# Patient Record
Sex: Female | Born: 1950 | Race: White | Hispanic: No | Marital: Single | Smoking: Never smoker
Health system: Southern US, Community
[De-identification: ages and names within clinical notes are randomized; demographics above are authoritative.]

## PROBLEM LIST (undated history)

## (undated) DIAGNOSIS — S0300XA Dislocation of jaw, unspecified side, initial encounter: Secondary | ICD-10-CM

## (undated) DIAGNOSIS — K219 Gastro-esophageal reflux disease without esophagitis: Secondary | ICD-10-CM

## (undated) DIAGNOSIS — T753XXA Motion sickness, initial encounter: Secondary | ICD-10-CM

## (undated) DIAGNOSIS — Z8489 Family history of other specified conditions: Secondary | ICD-10-CM

## (undated) DIAGNOSIS — K922 Gastrointestinal hemorrhage, unspecified: Secondary | ICD-10-CM

## (undated) HISTORY — PX: GANGLION CYST EXCISION: SHX1691

---

## 1997-12-29 ENCOUNTER — Other Ambulatory Visit: Admission: RE | Admit: 1997-12-29 | Discharge: 1997-12-29 | Payer: Self-pay | Admitting: Gynecology

## 1999-01-14 ENCOUNTER — Other Ambulatory Visit: Admission: RE | Admit: 1999-01-14 | Discharge: 1999-01-14 | Payer: Self-pay | Admitting: Gynecology

## 2000-01-17 ENCOUNTER — Other Ambulatory Visit: Admission: RE | Admit: 2000-01-17 | Discharge: 2000-01-17 | Payer: Self-pay | Admitting: Gynecology

## 2001-01-28 ENCOUNTER — Other Ambulatory Visit: Admission: RE | Admit: 2001-01-28 | Discharge: 2001-01-28 | Payer: Self-pay | Admitting: Gynecology

## 2002-01-29 ENCOUNTER — Other Ambulatory Visit: Admission: RE | Admit: 2002-01-29 | Discharge: 2002-01-29 | Payer: Self-pay | Admitting: Gynecology

## 2003-03-02 ENCOUNTER — Other Ambulatory Visit: Admission: RE | Admit: 2003-03-02 | Discharge: 2003-03-02 | Payer: Self-pay | Admitting: Gynecology

## 2003-07-25 HISTORY — PX: NASOLACRIMAL DUCT PROBING W/ INSERTION OF STENT: SHX2074

## 2004-03-02 ENCOUNTER — Other Ambulatory Visit: Admission: RE | Admit: 2004-03-02 | Discharge: 2004-03-02 | Payer: Self-pay | Admitting: Gynecology

## 2005-04-05 ENCOUNTER — Other Ambulatory Visit: Admission: RE | Admit: 2005-04-05 | Discharge: 2005-04-05 | Payer: Self-pay | Admitting: Gynecology

## 2005-09-29 DIAGNOSIS — N951 Menopausal and female climacteric states: Secondary | ICD-10-CM | POA: Insufficient documentation

## 2006-04-09 ENCOUNTER — Other Ambulatory Visit: Admission: RE | Admit: 2006-04-09 | Discharge: 2006-04-09 | Payer: Self-pay | Admitting: Gynecology

## 2006-12-13 ENCOUNTER — Ambulatory Visit: Payer: Self-pay

## 2008-04-09 ENCOUNTER — Other Ambulatory Visit: Admission: RE | Admit: 2008-04-09 | Discharge: 2008-04-09 | Payer: Self-pay | Admitting: Gynecology

## 2009-09-14 ENCOUNTER — Ambulatory Visit: Payer: Self-pay | Admitting: Family Medicine

## 2009-12-27 DIAGNOSIS — E559 Vitamin D deficiency, unspecified: Secondary | ICD-10-CM | POA: Insufficient documentation

## 2010-11-07 ENCOUNTER — Ambulatory Visit: Payer: Self-pay | Admitting: Family Medicine

## 2010-12-15 ENCOUNTER — Ambulatory Visit: Payer: Self-pay | Admitting: Family Medicine

## 2011-10-31 LAB — HM DEXA SCAN

## 2011-11-30 ENCOUNTER — Ambulatory Visit: Payer: Self-pay | Admitting: Family Medicine

## 2011-11-30 LAB — HM MAMMOGRAPHY

## 2014-09-30 ENCOUNTER — Other Ambulatory Visit: Payer: Self-pay | Admitting: Gynecology

## 2014-09-30 DIAGNOSIS — R928 Other abnormal and inconclusive findings on diagnostic imaging of breast: Secondary | ICD-10-CM

## 2014-10-07 ENCOUNTER — Ambulatory Visit
Admission: RE | Admit: 2014-10-07 | Discharge: 2014-10-07 | Disposition: A | Discharge status: Home / Self Care | Payer: 59 | Source: Ambulatory Visit | Attending: Gynecology | Admitting: Gynecology

## 2014-10-07 DIAGNOSIS — R928 Other abnormal and inconclusive findings on diagnostic imaging of breast: Secondary | ICD-10-CM

## 2015-03-10 ENCOUNTER — Other Ambulatory Visit: Payer: Self-pay

## 2015-03-10 DIAGNOSIS — K30 Functional dyspepsia: Secondary | ICD-10-CM | POA: Insufficient documentation

## 2015-03-10 DIAGNOSIS — R638 Other symptoms and signs concerning food and fluid intake: Secondary | ICD-10-CM | POA: Insufficient documentation

## 2015-03-22 ENCOUNTER — Encounter: Payer: Self-pay | Admitting: Family Medicine

## 2015-04-12 ENCOUNTER — Other Ambulatory Visit: Payer: Self-pay | Admitting: Family Medicine

## 2015-04-12 ENCOUNTER — Ambulatory Visit (INDEPENDENT_AMBULATORY_CARE_PROVIDER_SITE_OTHER): Payer: 59 | Admitting: Family Medicine

## 2015-04-12 ENCOUNTER — Encounter: Payer: Self-pay | Admitting: Family Medicine

## 2015-04-12 VITALS — BP 160/84 | HR 92 | Temp 98.3°F | Resp 18 | Ht 67.75 in | Wt 141.8 lb

## 2015-04-12 DIAGNOSIS — M419 Scoliosis, unspecified: Secondary | ICD-10-CM

## 2015-04-12 DIAGNOSIS — M4124 Other idiopathic scoliosis, thoracic region: Secondary | ICD-10-CM | POA: Diagnosis not present

## 2015-04-12 DIAGNOSIS — R3915 Urgency of urination: Secondary | ICD-10-CM | POA: Diagnosis not present

## 2015-04-12 DIAGNOSIS — Z0000 Encounter for general adult medical examination without abnormal findings: Secondary | ICD-10-CM | POA: Diagnosis not present

## 2015-04-12 LAB — POCT URINALYSIS DIPSTICK
Bilirubin, UA: NEGATIVE
Blood, UA: NEGATIVE
Glucose, UA: NEGATIVE
Ketones, UA: NEGATIVE
Nitrite, UA: NEGATIVE
Protein, UA: NEGATIVE
Spec Grav, UA: 1.01
Urobilinogen, UA: 0.2
pH, UA: 7.5

## 2015-04-12 NOTE — Progress Notes (Signed)
Patient ID: Robin Warren, female   DOB: [birth year], 64 y.o.   MRN: [id number]       Patient: Robin Warren, Female    DOB: 1950/11/22, 64 y.o.   MRN: [id number] Visit Date: 04/12/2015  Today's Provider: Vernie Murders, PA   Chief Complaint  Patient presents with  . Annual Exam   Subjective:    Annual physical exam Robin Warren is a 64 y.o. female who presents today for health maintenance and complete physical. She feels well. She reports exercising twice a week. She reports she is sleeping fairly well (6-7 hours a night).  -----------------------------------------------------------------   Review of Systems  Constitutional: Negative.   HENT: Negative.   Eyes: Negative.   Respiratory: Negative.   Cardiovascular: Negative.   Gastrointestinal: Negative.   Endocrine: Negative.   Genitourinary: Negative.   Musculoskeletal: Negative.   Skin: Negative.   Allergic/Immunologic: Negative.   Neurological: Negative.   Hematological: Negative.   Psychiatric/Behavioral: Negative.     Social History She  reports that she has never smoked. She has never used smokeless tobacco. She reports that she drinks alcohol. She reports that she does not use illicit drugs. Social History   Social History  . Marital Status: Single    Spouse Name: N/A  . Number of Children: N/A  . Years of Education: N/A   Social History Main Topics  . Smoking status: Never Smoker   . Smokeless tobacco: Never Used  . Alcohol Use: 0.0 oz/week    0 Standard drinks or equivalent per week     Comment: OCCASIONALLY  . Drug Use: No  . Sexual Activity: Not Asked   Other Topics Concern  . None   Social History Narrative    Patient Active Problem List   Diagnosis Date Noted  . Acid indigestion 03/10/2015  . Abnormal craving 03/10/2015  . Avitaminosis D 12/27/2009  . Menopausal symptom 09/29/2005    Past Surgical History  Procedure Laterality Date  . Nasolacrimal duct probing w/ insertion of stent   2005    Family History  Family Status  Relation Status Death Age  . Mother Deceased   . Maternal Grandmother Deceased   . Maternal Grandfather Deceased   . Paternal Grandmother Deceased   . Paternal Grandfather Deceased    Her family history includes Healthy in her sister; Heart attack in her paternal grandfather; Heart failure in her maternal grandfather and paternal grandfather; Parkinson's disease in her paternal grandmother; Stroke in her maternal grandmother and paternal grandfather.    Allergies  Allergen Reactions  . Aspirin     Stomach bleed    Previous Medications   DIPHENHYDRAMINE-ACETAMINOPHEN (TYLENOL PM) 25-500 MG TABS    Take 1 tablet by mouth at bedtime.   MULTIPLE VITAMIN PO    Take 1 tablet by mouth daily.   RANITIDINE (ZANTAC) 75 MG TABLET    Take 1 tablet by mouth as needed.    Patient Care Team: Margo Common, PA as PCP - General (Physician Assistant)     Objective:   Vitals: BP 160/84 mmHg  Pulse 92  Temp(Src) 98.3 F (36.8 C) (Oral)  Resp 18  Ht 5' 7.75" (1.721 m)  Wt 141 lb 12.8 oz (64.32 kg)  BMI 21.72 kg/m2  SpO2 98%   Physical Exam  Constitutional: She is oriented to person, place, and time. She appears well-developed and well-nourished.  HENT:  Head: Normocephalic and atraumatic.  Right Ear: External ear normal.  Left Ear: External ear normal.  Nose: Nose normal.  Mouth/Throat: Oropharynx is clear and moist.  Eyes: Conjunctivae and EOM are normal. Pupils are equal, round, and reactive to light.  Neck: Normal range of motion. Neck supple.  Cardiovascular: Normal rate, regular rhythm, normal heart sounds and intact distal pulses.   Pulmonary/Chest: Effort normal and breath sounds normal.  Abdominal: Soft. Bowel sounds are normal.  Genitourinary:  Breast exam, mammograms and PAP accomplished by Dr. Carren Rang (GYN) March 2016.  Musculoskeletal: Normal range of motion.  Thoracic scoliosis with convexity to the right.  Neurological: She  is alert and oriented to person, place, and time. She has normal reflexes.  Skin: Skin is warm and dry.  Psychiatric: She has a normal mood and affect. Her behavior is normal. Thought content normal.   Depression Screen Sleeping 6-7 hours a night. No spontaneous crying spells, good appetite, very interactive and bright spirits.  Assessment & Plan:     Routine Health Maintenance and Physical Exam  Exercise Activities and Dietary recommendations Continue regular exercise at [employer] twice a week.  Immunization History  Administered Date(s) Administered  . Tdap 10/23/2011  . Zoster 01/31/2013    There are no preventive care reminders to display for this patient.    Discussed health benefits of physical activity, and encouraged her to engage in regular exercise appropriate for her age and condition.    --------------------------------------------------------------------  1. Annual physical exam Good general health. Declines colonoscopy. Last Tdap was 10-23-11 and Zostavax was 01-31-13. Wants to check with insurance about Prevnar coverage. Las BMD was in 2015 by her GYN and reports as being normal. BP elevated upon arrival to the office today (coming down to 146/90 in 10 minutes). Recommend she limit salt intake (admits to free usage) and recheck BP. Will bring lab reports from recent health screening at work to see if additional labs needed. - POCT urinalysis dipstick  2. Thoracic scoliosis Stable. 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 May use Tylenol or Percogesic prn with moist heat applications. May need repeat x-ray evaluation if progressive discomfort.  3. Urgency of urination Recent awareness of urgency to urinate without frequency or dysuria. No hematuria and some WBC's on urinalysis today. Will check urine C&S to rule out infection. Encouraged to increase water intake to flush system. Recheck pending culture report. - Urine culture

## 2015-04-14 LAB — URINE CULTURE

## 2015-04-15 ENCOUNTER — Telehealth: Payer: Self-pay

## 2015-04-15 DIAGNOSIS — R350 Frequency of micturition: Secondary | ICD-10-CM

## 2015-04-15 DIAGNOSIS — R3915 Urgency of urination: Secondary | ICD-10-CM

## 2015-04-15 MED ORDER — NITROFURANTOIN MONOHYD MACRO 100 MG PO CAPS: 100.0000 mg | ORAL_CAPSULE | Freq: Two times a day (BID) | ORAL | Status: DC

## 2015-04-15 NOTE — Telephone Encounter (Signed)
Patient advised as directed below. Patient verbalized understanding. Patient states the frequency and urgency continues. RX sent to pharmacy. Patient scheduled for follow up appointment.

## 2015-04-15 NOTE — Telephone Encounter (Signed)
-----   Message from Margo Common, [place] sent at 04/15/2015  8:38 AM EDT ----- No pathologic growth of bacteria on culture. If frequency and urgency continues, recommend Macrobid 100 mg BID #14 and recheck urine in 10 days. Otherwise, drinking extra fluids should flush this out. May use Cranberry capsules or juice (4-6 ounces a day) to help.

## 2015-04-26 ENCOUNTER — Ambulatory Visit (INDEPENDENT_AMBULATORY_CARE_PROVIDER_SITE_OTHER): Payer: 59 | Admitting: Family Medicine

## 2015-04-26 ENCOUNTER — Encounter: Payer: Self-pay | Admitting: Family Medicine

## 2015-04-26 VITALS — BP 148/80 | HR 106 | Temp 98.4°F | Resp 18 | Wt 140.8 lb

## 2015-04-26 DIAGNOSIS — IMO0001 Reserved for inherently not codable concepts without codable children: Secondary | ICD-10-CM

## 2015-04-26 DIAGNOSIS — R030 Elevated blood-pressure reading, without diagnosis of hypertension: Secondary | ICD-10-CM | POA: Diagnosis not present

## 2015-04-26 DIAGNOSIS — R3915 Urgency of urination: Secondary | ICD-10-CM | POA: Diagnosis not present

## 2015-04-26 LAB — POCT URINALYSIS DIPSTICK
Bilirubin, UA: NEGATIVE
Glucose, UA: NEGATIVE
Ketones, UA: NEGATIVE
Nitrite, UA: NEGATIVE
Protein, UA: NEGATIVE
Spec Grav, UA: 1.02
Urobilinogen, UA: 0.2
pH, UA: 6

## 2015-04-26 NOTE — Progress Notes (Signed)
Patient ID: Robin Warren, female   DOB: 1951/06/16, 64 y.o.   MRN: [id number]   Chief Complaint  Patient presents with  . Follow-up   Subjective:  HPI  This 64 year old female presents for follow up of elevated BP and urinary urgency at CPE on 04-12-15. Has been drinking extra water/fluids. Urine culture was negative for bacterial growth. Has been restricting sodium intake and checking BP at home (ranging from 150's/80's. Denies chest pains, dyspnea, diaphoresis or palpitations. Took the Macrobid until culture report showed no bacteria.   Prior to Admission medications   Medication Sig Start Date End Date Taking? Authorizing Provider  diphenhydramine-acetaminophen (TYLENOL PM) 25-500 MG TABS Take 1 tablet by mouth at bedtime.   Yes Historical Provider, MD  MULTIPLE VITAMIN PO Take 1 tablet by mouth daily.   Yes Historical Provider, MD  nitrofurantoin, macrocrystal-monohydrate, (MACROBID) 100 MG capsule Take 1 capsule (100 mg total) by mouth 2 (two) times daily. 04/15/15  Yes Dennis E Chrismon, PA  ranitidine (ZANTAC) 75 MG tablet Take 1 tablet by mouth as needed.   Yes Historical Provider, MD    Patient Active Problem List   Diagnosis Date Noted  . Thoracic scoliosis 04/12/2015  . Acid indigestion 03/10/2015  . Abnormal craving 03/10/2015  . Avitaminosis D 12/27/2009  . Menopausal symptom 09/29/2005   Social History   Social History  . Marital Status: Single    Spouse Name: N/A  . Number of Children: N/A  . Years of Education: N/A   Occupational History  . Not on file.   Social History Main Topics  . Smoking status: Never Smoker   . Smokeless tobacco: Never Used  . Alcohol Use: 0.0 oz/week    0 Standard drinks or equivalent per week     Comment: OCCASIONALLY  . Drug Use: No  . Sexual Activity: Not on file   Other Topics Concern  . Not on file   Social History Narrative    Allergies  Allergen Reactions  . Aspirin     Stomach bleed    Review of Systems    Constitutional: Negative.   HENT: Negative.   Eyes: Negative.   Respiratory: Negative.   Cardiovascular: Negative.   Gastrointestinal: Negative.   Genitourinary: Positive for urgency.  Musculoskeletal: Negative.   Skin: Negative.   Neurological: Negative.   Endo/Heme/Allergies: Negative.   Psychiatric/Behavioral: Negative.     Immunization History  Administered Date(s) Administered  . Tdap 10/23/2011  . Zoster 01/31/2013   Objective:  BP 148/80 mmHg  Pulse 106  Temp(Src) 98.4 F (36.9 C) (Oral)  Resp 18  Wt 140 lb 12.8 oz (63.866 kg)  SpO2 98%  Physical Exam  Constitutional: She is oriented to person, place, and time and well-developed, well-nourished, and in no distress.  HENT:  Head: Normocephalic.  Eyes: Conjunctivae are normal.  Cardiovascular: Normal rate and regular rhythm.   Pulmonary/Chest: Effort normal and breath sounds normal.  Abdominal: Soft. Bowel sounds are normal.  Musculoskeletal: Normal range of motion.  Neurological: She is alert and oriented to person, place, and time.  Psychiatric: Memory, affect and judgment normal.    Assessment and Plan :  1. Urgency of urination No further urgency symptoms. Urinalysis negative and urine culture report form 04-12-15 showed no bacterial growth. Finished the Macrobid and symptoms promptly went away. Continue to drink plenty of fluids and recheck prn. - POCT urinalysis dipstick  2. Elevated blood pressure BP coming down from the 160/84 at the last OV. Has  been restricting caffeine and sodium intake. Exercising routinely 2-3 times a week at [employer]. Labs at the Health Screening at work showed total cholesterol 206, HDL 86, LDL 97, triglycerides 114, glucose 84, Hgb A1C 5.8% and BMI was 21.4. Recheck BP in 2 months.   [facility] 04/26/2015 3:56 PM

## 2015-04-27 ENCOUNTER — Encounter: Payer: Self-pay | Admitting: Family Medicine

## 2015-05-13 ENCOUNTER — Telehealth: Payer: Self-pay | Admitting: Family Medicine

## 2015-05-13 DIAGNOSIS — J0100 Acute maxillary sinusitis, unspecified: Secondary | ICD-10-CM

## 2015-05-13 MED ORDER — AMOXICILLIN 875 MG PO TABS: 875.0000 mg | ORAL_TABLET | Freq: Two times a day (BID) | ORAL | Status: DC

## 2015-05-13 NOTE — Telephone Encounter (Signed)
Developed sinus pressure headache, nausea, diarrhea, nasal stuffiness and some chills 2 days ago. Tried Allegra-D and Tylenol with Meclizine. Diarrhea and nausea has abated but still having headache and now some cough. Started some leftover Amoxicillin and feeling better today. Will refill Amoxicillin to complete a 10 day regime. May add Delsym or Mucinex-DM for cough. Increase fluid intake and rest at home today. If no better in 3 days, should schedule appointment in the office. Patient agrees with plan.

## 2015-07-06 ENCOUNTER — Ambulatory Visit: Payer: 59 | Admitting: Family Medicine

## 2015-07-09 ENCOUNTER — Ambulatory Visit (INDEPENDENT_AMBULATORY_CARE_PROVIDER_SITE_OTHER): Payer: 59 | Admitting: Family Medicine

## 2015-07-09 ENCOUNTER — Encounter: Payer: Self-pay | Admitting: Family Medicine

## 2015-07-09 VITALS — BP 162/94 | HR 80 | Temp 98.2°F | Resp 16 | Wt 145.8 lb

## 2015-07-09 DIAGNOSIS — I10 Essential (primary) hypertension: Secondary | ICD-10-CM

## 2015-07-09 MED ORDER — METOPROLOL TARTRATE 25 MG PO TABS: ORAL_TABLET | ORAL | Status: DC

## 2015-07-09 NOTE — Patient Instructions (Signed)
Hypertension Hypertension, commonly called high blood pressure, is when the force of blood pumping through your arteries is too strong. Your arteries are the blood vessels that carry blood from your heart throughout your body. A blood pressure reading consists of a higher number over a lower number, such as 110/72. The higher number (systolic) is the pressure inside your arteries when your heart pumps. The lower number (diastolic) is the pressure inside your arteries when your heart relaxes. Ideally you want your blood pressure below 120/80. Hypertension forces your heart to work harder to pump blood. Your arteries may become narrow or stiff. Having untreated or uncontrolled hypertension can cause heart attack, stroke, kidney disease, and other problems. RISK FACTORS Some risk factors for high blood pressure are controllable. Others are not.  Risk factors you cannot control include:   Race. You may be at higher risk if you are African American.  Age. Risk increases with age.  Gender. Men are at higher risk than women before age 45 years. After age 65, women are at higher risk than men. Risk factors you can control include:  Not getting enough exercise or physical activity.  Being overweight.  Getting too much fat, sugar, calories, or salt in your diet.  Drinking too much alcohol. SIGNS AND SYMPTOMS Hypertension does not usually cause signs or symptoms. Extremely high blood pressure (hypertensive crisis) may cause headache, anxiety, shortness of breath, and nosebleed. DIAGNOSIS To check if you have hypertension, your health care provider will measure your blood pressure while you are seated, with your arm held at the level of your heart. It should be measured at least twice using the same arm. Certain conditions can cause a difference in blood pressure between your right and left arms. A blood pressure reading that is higher than normal on one occasion does not mean that you need treatment. If  it is not clear whether you have high blood pressure, you may be asked to return on a different day to have your blood pressure checked again. Or, you may be asked to monitor your blood pressure at home for 1 or more weeks. TREATMENT Treating high blood pressure includes making lifestyle changes and possibly taking medicine. Living a healthy lifestyle can help lower high blood pressure. You may need to change some of your habits. Lifestyle changes may include:  Following the DASH diet. This diet is high in fruits, vegetables, and whole grains. It is low in salt, red meat, and added sugars.  Keep your sodium intake below 2,300 mg per day.  Getting at least 30-45 minutes of aerobic exercise at least 4 times per week.  Losing weight if necessary.  Not smoking.  Limiting alcoholic beverages.  Learning ways to reduce stress. Your health care provider may prescribe medicine if lifestyle changes are not enough to get your blood pressure under control, and if one of the following is true:  You are 18-59 years of age and your systolic blood pressure is above 140.  You are 60 years of age or older, and your systolic blood pressure is above 150.  Your diastolic blood pressure is above 90.  You have diabetes, and your systolic blood pressure is over 140 or your diastolic blood pressure is over 90.  You have kidney disease and your blood pressure is above 140/90.  You have heart disease and your blood pressure is above 140/90. Your personal target blood pressure may vary depending on your medical conditions, your age, and other factors. HOME CARE INSTRUCTIONS    Have your blood pressure rechecked as directed by your health care provider.   Take medicines only as directed by your health care provider. Follow the directions carefully. Blood pressure medicines must be taken as prescribed. The medicine does not work as well when you skip doses. Skipping doses also puts you at risk for  problems.  Do not smoke.   Monitor your blood pressure at home as directed by your health care provider. SEEK MEDICAL CARE IF:   You think you are having a reaction to medicines taken.  You have recurrent headaches or feel dizzy.  You have swelling in your ankles.  You have trouble with your vision. SEEK IMMEDIATE MEDICAL CARE IF:  You develop a severe headache or confusion.  You have unusual weakness, numbness, or feel faint.  You have severe chest or abdominal pain.  You vomit repeatedly.  You have trouble breathing. MAKE SURE YOU:   Understand these instructions.  Will watch your condition.  Will get help right away if you are not doing well or get worse.   This information is not intended to replace advice given to you by your health care provider. Make sure you discuss any questions you have with your health care provider.   Document Released: 07/10/2005 Document Revised: 11/24/2014 Document Reviewed: 05/02/2013 Elsevier Interactive Patient Education 2016 [employer] Inc[employer]  

## 2015-07-09 NOTE — Progress Notes (Signed)
Patient ID: Robin Warren, female   DOB: 17-Mar-1951, 64 y.o.   MRN: [id number]   Patient: Robin Warren Female    DOB: 06-23-1951   64 y.o.   MRN: [id number] Visit Date: 07/09/2015  Today's Provider: Vernie Murders, PA   Chief Complaint  Patient presents with  . Hypertension   Subjective:    Hypertension This is a new problem. The problem is uncontrolled. Past treatments include nothing.      Previous Medications   AMOXICILLIN (AMOXIL) 875 MG TABLET    Take 1 tablet (875 mg total) by mouth 2 (two) times daily.   DIPHENHYDRAMINE-ACETAMINOPHEN (TYLENOL PM) 25-500 MG TABS    Take 1 tablet by mouth at bedtime.   MULTIPLE VITAMIN PO    Take 1 tablet by mouth daily.   RANITIDINE (ZANTAC) 75 MG TABLET    Take 1 tablet by mouth as needed.    Review of Systems  Constitutional: Negative.   HENT: Negative.   Eyes: Negative.   Respiratory: Negative.   Cardiovascular: Negative.   Gastrointestinal: Negative.   Endocrine: Negative.   Genitourinary: Negative.   Musculoskeletal: Negative.   Skin: Negative.   Allergic/Immunologic: Negative.   Neurological: Negative.   Psychiatric/Behavioral: Negative.     Social History  Substance Use Topics  . Smoking status: Never Smoker   . Smokeless tobacco: Never Used  . Alcohol Use: 0.0 oz/week    0 Standard drinks or equivalent per week     Comment: OCCASIONALLY   Objective:   BP 162/94 mmHg  Pulse 80  Temp(Src) 98.2 F (36.8 C) (Oral)  Resp 16  Wt 145 lb 12.8 oz (66.134 kg) BP Readings from Last 3 Encounters:  07/09/15 162/94  04/26/15 148/80  04/12/15 160/84    Physical Exam  Constitutional: She is oriented to person, place, and time. She appears well-developed and well-nourished.  HENT:  Head: Normocephalic.  Right Ear: External ear normal.  Left Ear: External ear normal.  Nose: Nose normal.  Mouth/Throat: Oropharynx is clear and moist.  Eyes: Conjunctivae and EOM are normal. Pupils are equal, round, and reactive to light.    Neck: Normal range of motion. Neck supple.  Cardiovascular: Normal rate, regular rhythm, normal heart sounds and intact distal pulses.   No carotid bruit.  Pulmonary/Chest: Effort normal and breath sounds normal.  Abdominal: Soft. Bowel sounds are normal.  Neurological: She is alert and oriented to person, place, and time.  Psychiatric: She has a normal mood and affect. Her behavior is normal.      Assessment & Plan:     1. Essential hypertension Despite diet, exercise and life style changes, continues to have elevation of BP. Acknowledges some anxiety at times. Will treat with low dose beta blocker and recheck in 1 month. Also, will check BP at home regularly. - metoprolol tartrate (LOPRESSOR) 25 MG tablet; One tablet daily by mouth  Dispense: 30 tablet; Refill: 3

## 2015-08-16 ENCOUNTER — Encounter: Payer: Self-pay | Admitting: Family Medicine

## 2015-08-16 ENCOUNTER — Ambulatory Visit (INDEPENDENT_AMBULATORY_CARE_PROVIDER_SITE_OTHER): Payer: 59 | Admitting: Family Medicine

## 2015-08-16 VITALS — BP 150/78 | HR 84 | Temp 98.6°F | Resp 16 | Wt 142.2 lb

## 2015-08-16 DIAGNOSIS — I10 Essential (primary) hypertension: Secondary | ICD-10-CM | POA: Diagnosis not present

## 2015-08-16 NOTE — Progress Notes (Signed)
Patient ID: Robin Warren, female   DOB: [birth year], 65 y.o.   MRN: [id number]   Patient: Robin Warren Female    DOB: Dec 03, 1950   65 y.o.   MRN: [id number] Visit Date: 08/16/2015  Today's Provider: Vernie Murders, PA   Chief Complaint  Patient presents with  . Hypertension  . Follow-up   Subjective:    HPI  Hypertension, follow-up:  BP Readings from Last 3 Encounters:  08/16/15 150/78  07/09/15 162/94  04/26/15 148/80    She was last seen for hypertension 1 months ago.  BP at that visit was 162/94. Management changes since that visit include none. She reports excellent compliance with treatment. She is not having side effects.  She is exercising. She is adherent to low salt diet.   She is experiencing none.  Patient denies none.   Cardiovascular risk factors include none.  Use of agents associated with hypertension: none and Metoprolol 25 mg.     Weight trend: stable Wt Readings from Last 3 Encounters:  08/16/15 142 lb 3.2 oz (64.501 kg)  07/09/15 145 lb 12.8 oz (66.134 kg)  04/26/15 140 lb 12.8 oz (63.866 kg)    Current diet: in general, a "healthy" diet    ------------------------------------------------------------------------ Patient Active Problem List   Diagnosis Date Noted  . Thoracic scoliosis 04/12/2015  . Acid indigestion 03/10/2015  . Abnormal craving 03/10/2015  . Avitaminosis D 12/27/2009  . Menopausal symptom 09/29/2005   Past Surgical History  Procedure Laterality Date  . Nasolacrimal duct probing w/ insertion of stent  2005   Family History  Problem Relation Age of Onset  . Healthy Sister   . Stroke Maternal Grandmother   . Heart failure Maternal Grandfather   . Parkinson's disease Paternal Grandmother   . Heart attack Paternal Grandfather   . Heart failure Paternal Grandfather   . Stroke Paternal Grandfather    Allergies  Allergen Reactions  . Aspirin     Stomach bleed     Previous Medications   DIPHENHYDRAMINE-ACETAMINOPHEN  (TYLENOL PM) 25-500 MG TABS    Take 1 tablet by mouth at bedtime.   METOPROLOL TARTRATE (LOPRESSOR) 25 MG TABLET    One tablet daily by mouth   MULTIPLE VITAMIN PO    Take 1 tablet by mouth daily.   RANITIDINE (ZANTAC) 75 MG TABLET    Take 1 tablet by mouth as needed.    Review of Systems  Constitutional: Negative.   HENT: Negative.   Eyes: Negative.   Respiratory: Negative.   Cardiovascular: Negative.   Gastrointestinal: Negative.   Endocrine: Negative.   Genitourinary: Negative.   Musculoskeletal: Negative.   Skin: Negative.   Allergic/Immunologic: Negative.   Neurological: Negative.   Hematological: Negative.   Psychiatric/Behavioral: Negative.     Social History  Substance Use Topics  . Smoking status: Never Smoker   . Smokeless tobacco: Never Used  . Alcohol Use: 0.0 oz/week    0 Standard drinks or equivalent per week     Comment: OCCASIONALLY   Objective:   BP 150/78 mmHg  Pulse 84  Temp(Src) 98.6 F (37 C) (Oral)  Resp 16  Wt 142 lb 3.2 oz (64.501 kg)  Physical Exam  Constitutional: She is oriented to person, place, and time. She appears well-developed and well-nourished.  HENT:  Head: Normocephalic.  Nose: Nose normal.  Eyes: Conjunctivae and EOM are normal.  Neck: Neck supple.  Cardiovascular: Normal rate and regular rhythm.   Pulmonary/Chest: Effort normal and breath sounds normal.  Musculoskeletal: Normal range of motion.  Neurological: She is alert and oriented to person, place, and time.  Psychiatric: She has a normal mood and affect. Her behavior is normal. Thought content normal.      Assessment & Plan:     1. Essential hypertension Tolerating Metoprolol without side effects. Feeling well and better BP control. Be sure to follow sodium restricted diet and continue exercise program. Recheck in 3 months.

## 2015-11-15 ENCOUNTER — Ambulatory Visit (INDEPENDENT_AMBULATORY_CARE_PROVIDER_SITE_OTHER): Payer: 59 | Admitting: Family Medicine

## 2015-11-15 ENCOUNTER — Encounter: Payer: Self-pay | Admitting: Family Medicine

## 2015-11-15 VITALS — BP 128/90 | HR 75 | Temp 98.3°F | Resp 14 | Wt 146.0 lb

## 2015-11-15 DIAGNOSIS — E559 Vitamin D deficiency, unspecified: Secondary | ICD-10-CM | POA: Diagnosis not present

## 2015-11-15 DIAGNOSIS — I10 Essential (primary) hypertension: Secondary | ICD-10-CM

## 2015-11-15 NOTE — Progress Notes (Signed)
Patient ID: Robin Warren, female   DOB: July 29, 1950, 65 y.o.   MRN: [id number]   Patient: Robin Warren Female    DOB: 02/10/1951   65 y.o.   MRN: [id number] Visit Date: 11/15/2015  Today's Provider: Vernie Murders, PA   Chief Complaint  Patient presents with  . Hypertension  . Follow-up   Subjective:    HPI  Hypertension, follow-up:  BP Readings from Last 3 Encounters:  11/15/15 128/90  08/16/15 150/78  07/09/15 162/94    She was last seen for hypertension 3 months ago.  BP at that visit was 150/78. Management changes since that visit include none. She reports good compliance with treatment. She is not having side effects.  She is exercising. She is adherent to low salt diet.   Outside blood pressures are being checked. She is experiencing none.  Patient denies none.   Cardiovascular risk factors include none.  Use of agents associated with hypertension: none.     Weight trend: stable Wt Readings from Last 3 Encounters:  11/15/15 146 lb (66.225 kg)  08/16/15 142 lb 3.2 oz (64.501 kg)  07/09/15 145 lb 12.8 oz (66.134 kg)    ------------------------------------------------------------------------   Patient Active Problem List   Diagnosis Date Noted  . Thoracic scoliosis 04/12/2015  . Acid indigestion 03/10/2015  . Abnormal craving 03/10/2015  . Avitaminosis D 12/27/2009  . Menopausal symptom 09/29/2005   Past Surgical History  Procedure Laterality Date  . Nasolacrimal duct probing w/ insertion of stent  2005   Family History  Problem Relation Age of Onset  . Healthy Sister   . Stroke Maternal Grandmother   . Heart failure Maternal Grandfather   . Parkinson's disease Paternal Grandmother   . Heart attack Paternal Grandfather   . Heart failure Paternal Grandfather   . Stroke Paternal Grandfather    Previous Medications   DIPHENHYDRAMINE-ACETAMINOPHEN (TYLENOL PM) 25-500 MG TABS    Take 1 tablet by mouth at bedtime.   METOPROLOL TARTRATE (LOPRESSOR) 25  MG TABLET    One tablet daily by mouth   MULTIPLE VITAMIN PO    Take 1 tablet by mouth daily.   RANITIDINE (ZANTAC) 75 MG TABLET    Take 1 tablet by mouth as needed.   Allergies  Allergen Reactions  . Aspirin     Stomach bleed    Review of Systems  Constitutional: Negative.   HENT: Negative.   Eyes: Negative.   Respiratory: Negative.   Cardiovascular: Negative.   Gastrointestinal: Negative.   Endocrine: Negative.   Genitourinary: Negative.   Musculoskeletal: Negative.   Skin: Negative.   Allergic/Immunologic: Negative.   Neurological: Negative.   Hematological: Negative.   Psychiatric/Behavioral: Negative.     Social History  Substance Use Topics  . Smoking status: Never Smoker   . Smokeless tobacco: Never Used  . Alcohol Use: 0.0 oz/week    0 Standard drinks or equivalent per week     Comment: OCCASIONALLY   Objective:   BP 128/90 mmHg  Pulse 75  Temp(Src) 98.3 F (36.8 C) (Oral)  Resp 14  Wt 146 lb (66.225 kg) Body mass index is 22.36 kg/(m^2).  Wt Readings from Last 3 Encounters:  11/15/15 146 lb (66.225 kg)  08/16/15 142 lb 3.2 oz (64.501 kg)  07/09/15 145 lb 12.8 oz (66.134 kg)    Physical Exam  Constitutional: She is oriented to person, place, and time. She appears well-developed and well-nourished. No distress.  HENT:  Head: Normocephalic and atraumatic.  Right Ear:  Hearing normal.  Left Ear: Hearing normal.  Nose: Nose normal.  Eyes: Conjunctivae and lids are normal. Right eye exhibits no discharge. Left eye exhibits no discharge. No scleral icterus.  Cardiovascular: Normal rate and regular rhythm.   Pulmonary/Chest: Effort normal and breath sounds normal. No respiratory distress.  Abdominal: Soft. Bowel sounds are normal.  Musculoskeletal: Normal range of motion.  Neurological: She is alert and oriented to person, place, and time.  Skin: Skin is intact. No lesion and no rash noted.  Psychiatric: She has a normal mood and affect. Her speech is  normal and behavior is normal. Thought content normal.      Assessment & Plan:     1. Essential hypertension Feeling well and tolerating Metoprolol 25 mg qd without fatigue, palpitations or dyspnea. Weight is stable and good BP control. Continue present dosage and check routine labs. Follow up pending reports. - CBC with Differential/Platelet - Comprehensive metabolic panel  2. Avitaminosis D Better compliance with taking Vitamin-D 1000 IU qd since starting BP medications. Recheck blood levels and recheck pending report. - CBC with Differential/Platelet - VITAMIN D 25 Hydroxy (Vit-D Deficiency, Fractures)

## 2015-11-16 ENCOUNTER — Other Ambulatory Visit: Payer: Self-pay

## 2015-11-16 DIAGNOSIS — I10 Essential (primary) hypertension: Secondary | ICD-10-CM

## 2015-11-16 MED ORDER — METOPROLOL TARTRATE 25 MG PO TABS: ORAL_TABLET | ORAL | Status: DC

## 2015-11-16 NOTE — Telephone Encounter (Signed)
Pharmacy requesting refill. Patient uses [facility].

## 2015-11-20 LAB — CBC WITH DIFFERENTIAL/PLATELET
Basophils Absolute: 0.1 10*3/uL (ref 0.0–0.2)
Basos: 1 %
EOS (ABSOLUTE): 0.2 10*3/uL (ref 0.0–0.4)
Eos: 2 %
Hematocrit: 38.3 % (ref 34.0–46.6)
Hemoglobin: 12.7 g/dL (ref 11.1–15.9)
Immature Grans (Abs): 0 10*3/uL (ref 0.0–0.1)
Immature Granulocytes: 0 %
Lymphocytes Absolute: 2.2 10*3/uL (ref 0.7–3.1)
Lymphs: 31 %
MCH: 30.2 pg (ref 26.6–33.0)
MCHC: 33.2 g/dL (ref 31.5–35.7)
MCV: 91 fL (ref 79–97)
Monocytes Absolute: 0.8 10*3/uL (ref 0.1–0.9)
Monocytes: 11 %
Neutrophils Absolute: 4 10*3/uL (ref 1.4–7.0)
Neutrophils: 55 %
Platelets: 276 10*3/uL (ref 150–379)
RBC: 4.21 x10E6/uL (ref 3.77–5.28)
RDW: 13.2 % (ref 12.3–15.4)
WBC: 7.2 10*3/uL (ref 3.4–10.8)

## 2015-11-20 LAB — COMPREHENSIVE METABOLIC PANEL
ALT: 16 IU/L (ref 0–32)
AST: 20 IU/L (ref 0–40)
Albumin/Globulin Ratio: 2 (ref 1.2–2.2)
Albumin: 4.4 g/dL (ref 3.6–4.8)
Alkaline Phosphatase: 70 IU/L (ref 39–117)
BUN/Creatinine Ratio: 18 (ref 12–28)
BUN: 12 mg/dL (ref 8–27)
Bilirubin Total: 0.3 mg/dL (ref 0.0–1.2)
CO2: 26 mmol/L (ref 18–29)
Calcium: 9.2 mg/dL (ref 8.7–10.3)
Chloride: 102 mmol/L (ref 96–106)
Creatinine, Ser: 0.66 mg/dL (ref 0.57–1.00)
GFR calc Af Amer: 109 mL/min/{1.73_m2} (ref >59)
GFR calc non Af Amer: 95 mL/min/{1.73_m2} (ref >59)
Globulin, Total: 2.2 g/dL (ref 1.5–4.5)
Glucose: 85 mg/dL (ref 65–99)
Potassium: 4.1 mmol/L (ref 3.5–5.2)
Sodium: 143 mmol/L (ref 134–144)
Total Protein: 6.6 g/dL (ref 6.0–8.5)

## 2015-11-20 LAB — VITAMIN D 25 HYDROXY (VIT D DEFICIENCY, FRACTURES): Vit D, 25-Hydroxy: 39.1 ng/mL (ref 30.0–100.0)

## 2015-11-22 ENCOUNTER — Telehealth: Payer: Self-pay

## 2015-11-22 NOTE — Telephone Encounter (Signed)
-----   Message from Margo Common, [place] sent at 11/22/2015  8:36 AM EDT ----- All blood tests normal. Vitamin-D level back to normal, also. Recommend taking 1000 IU Vitamin-D daily to maintain normal levels. Recheck annually.

## 2015-11-22 NOTE — Telephone Encounter (Signed)
LMTCB

## 2015-11-22 NOTE — Telephone Encounter (Signed)
Patient advised as directed below. Patient verbalized understanding.  

## 2016-01-10 ENCOUNTER — Encounter: Payer: Self-pay | Admitting: Family Medicine

## 2016-01-10 ENCOUNTER — Ambulatory Visit (INDEPENDENT_AMBULATORY_CARE_PROVIDER_SITE_OTHER): Payer: 59 | Admitting: Family Medicine

## 2016-01-10 VITALS — BP 124/80 | HR 68 | Temp 97.8°F | Resp 16 | Wt 148.0 lb

## 2016-01-10 DIAGNOSIS — M26629 Arthralgia of temporomandibular joint, unspecified side: Secondary | ICD-10-CM

## 2016-01-10 NOTE — Progress Notes (Signed)
Patient ID: Robin Warren, female   DOB: 10-Mar-1951, 65 y.o.   MRN: [id number]       Patient: Robin Warren Female    DOB: 07/03/1951   65 y.o.   MRN: [id number] Visit Date: 01/10/2016  Today's Provider: Vernie Murders, PA   Chief Complaint  Patient presents with  . Jaw Pain    X 5 days.    Subjective:    HPI  Patient reports that she has experienced "lock jaw" over the last 5 days. She reports that it happens at least once daily, and lasts for hours. Patient denies any injury.     Allergies  Allergen Reactions  . Aspirin     Stomach bleed   Current Meds  Medication Sig  . diphenhydramine-acetaminophen (TYLENOL PM) 25-500 MG TABS Take 1 tablet by mouth at bedtime.  . metoprolol tartrate (LOPRESSOR) 25 MG tablet One tablet daily by mouth  . MULTIPLE VITAMIN PO Take 1 tablet by mouth daily.  . ranitidine (ZANTAC) 75 MG tablet Take 1 tablet by mouth as needed.    Review of Systems  Constitutional: Negative.   Musculoskeletal: Positive for arthralgias. Negative for myalgias, back pain, joint swelling and gait problem.    Social History  Substance Use Topics  . Smoking status: Never Smoker   . Smokeless tobacco: Never Used  . Alcohol Use: 0.0 oz/week    0 Standard drinks or equivalent per week     Comment: OCCASIONALLY   Objective:   BP 124/80 mmHg  Pulse 68  Temp(Src) 97.8 F (36.6 C)  Resp 16  Wt 148 lb (67.132 kg)  Wt Readings from Last 3 Encounters:  01/10/16 148 lb (67.132 kg)  11/15/15 146 lb (66.225 kg)  08/16/15 142 lb 3.2 oz (64.501 kg)   Physical Exam  Constitutional: She is oriented to person, place, and time. She appears well-developed and well-nourished. No distress.  HENT:  Head: Normocephalic and atraumatic.  Right Ear: Hearing normal.  Left Ear: Hearing normal.  Nose: Nose normal.  Eyes: Conjunctivae and lids are normal. Right eye exhibits no discharge. Left eye exhibits no discharge. No scleral icterus.  Pulmonary/Chest: Effort normal. No  respiratory distress.  Musculoskeletal: Normal range of motion. She exhibits tenderness.  Left TMJ intermittently. Some clicking to test ROM today. Minimal discomfort. Usually has episodes of pain to open mouth in the evenings. Difficulty with brushing teeth some nights.  Neurological: She is alert and oriented to person, place, and time.  Skin: Skin is intact. No lesion and no rash noted.  Psychiatric: She has a normal mood and affect. Her speech is normal and behavior is normal. Thought content normal.      Assessment & Plan:     1. TMJ syndrome Onset with pain in the left joint to open mouth occasionally the past 5 days. Some pain daily. Warm soft foods or drink helps to relieve discomfort. Denies eating hard candy, ice or chewing gum. No known grinding of teeth. Had a crown put on a tooth in the right a week ago. Some clicking in the left jaw joint in the mornings without pain. May use Aleve and continue soft diet. Concerned about change in insurance (to [place]) since retiring and wants to postpone oral surgeon referral and/or x-ray/MRI scan. Given  exercises and diet recommendations. Recheck if no better in a month.       Vernie Murders, PA  [facility]

## 2016-01-10 NOTE — Patient Instructions (Signed)

## 2016-01-28 ENCOUNTER — Telehealth: Payer: Self-pay | Admitting: Family Medicine

## 2016-01-28 DIAGNOSIS — M26629 Arthralgia of temporomandibular joint, unspecified side: Secondary | ICD-10-CM

## 2016-01-28 NOTE — Telephone Encounter (Signed)
Pt was in on 01/10/2016 with mouth pain and her jaw trying to lock up.  Pt states this has happed again this past Saturday.  Pt states Robin Warren told her to call if this happen again.  CB#[phone]/MW

## 2016-02-01 NOTE — Telephone Encounter (Signed)
Patient reports that her lock jaw comes and goes, but it is becoming more bothersome. Patient is unsure of what else to do. Patient reports that she has not had any problems today, but she did yesterday. Any recommendations? Please advise. Thanks!

## 2016-02-02 NOTE — Telephone Encounter (Signed)
Will schedule for MRI of TMJ. Probably will need referral to oral surgeon (Dr. Barry Dienes).

## 2016-02-04 ENCOUNTER — Telehealth: Payer: Self-pay | Admitting: Family Medicine

## 2016-02-09 ENCOUNTER — Ambulatory Visit: Payer: 59

## 2016-02-09 ENCOUNTER — Ambulatory Visit (HOSPITAL_COMMUNITY): Admission: RE | Admit: 2016-02-09 | Payer: 59 | Source: Ambulatory Visit

## 2016-02-15 ENCOUNTER — Ambulatory Visit (HOSPITAL_COMMUNITY): Payer: 59

## 2016-02-22 ENCOUNTER — Ambulatory Visit (HOSPITAL_COMMUNITY)
Admission: RE | Admit: 2016-02-22 | Discharge: 2016-02-22 | Disposition: A | Discharge status: Home / Self Care | Payer: 59 | Source: Ambulatory Visit | Attending: Family Medicine | Admitting: Family Medicine

## 2016-02-22 DIAGNOSIS — M8588 Other specified disorders of bone density and structure, other site: Secondary | ICD-10-CM | POA: Diagnosis not present

## 2016-02-22 DIAGNOSIS — M2669 Other specified disorders of temporomandibular joint: Secondary | ICD-10-CM | POA: Diagnosis not present

## 2016-02-22 DIAGNOSIS — M26629 Arthralgia of temporomandibular joint, unspecified side: Secondary | ICD-10-CM | POA: Insufficient documentation

## 2016-02-25 NOTE — Telephone Encounter (Signed)
Having left jaw joint to lock up and ache. Keeps her from eating sometimes. 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 Schedule oral surgeon referral.

## 2016-03-02 NOTE — Telephone Encounter (Signed)
Appointment made with Dr Barry Dienes (Pt's request) 03/13/16 at 1:15.Phone [phone]

## 2016-07-08 ENCOUNTER — Ambulatory Visit (INDEPENDENT_AMBULATORY_CARE_PROVIDER_SITE_OTHER): Payer: 59 | Admitting: Family Medicine

## 2016-07-08 DIAGNOSIS — Z23 Encounter for immunization: Secondary | ICD-10-CM

## 2016-07-13 ENCOUNTER — Other Ambulatory Visit: Payer: Self-pay | Admitting: Family Medicine

## 2016-07-13 ENCOUNTER — Encounter: Payer: Self-pay | Admitting: Family Medicine

## 2016-07-13 MED ORDER — AMOXICILLIN 875 MG PO TABS: 875.0000 mg | ORAL_TABLET | Freq: Two times a day (BID) | ORAL | 0 refills | Status: DC

## 2016-07-13 NOTE — Progress Notes (Signed)
Having pressure pain headache around eyes with sore throat and PND over the past 2-3 days. Worried this will cause her to cancel her Christmas trip. Having general malaise but no documented fever yet. May use Claritin, saltwater gargles, Tylenol and Mucinex-D with Delsym prn. Add Amoxicillin if purulent nasal congestion or sputum develops. Recheck in 1 week if no better.

## 2016-12-04 ENCOUNTER — Encounter: Payer: Self-pay | Admitting: Family Medicine

## 2016-12-04 ENCOUNTER — Ambulatory Visit (INDEPENDENT_AMBULATORY_CARE_PROVIDER_SITE_OTHER): Payer: BLUE CROSS/BLUE SHIELD | Admitting: Family Medicine

## 2016-12-04 VITALS — BP 124/78 | HR 76 | Temp 98.3°F | Resp 16 | Ht 67.5 in | Wt 145.0 lb

## 2016-12-04 DIAGNOSIS — M26629 Arthralgia of temporomandibular joint, unspecified side: Secondary | ICD-10-CM | POA: Insufficient documentation

## 2016-12-04 DIAGNOSIS — Z0000 Encounter for general adult medical examination without abnormal findings: Secondary | ICD-10-CM | POA: Insufficient documentation

## 2016-12-04 DIAGNOSIS — M4134 Thoracogenic scoliosis, thoracic region: Secondary | ICD-10-CM

## 2016-12-04 DIAGNOSIS — Z114 Encounter for screening for human immunodeficiency virus [HIV]: Secondary | ICD-10-CM

## 2016-12-04 NOTE — Patient Instructions (Signed)
Health Maintenance, Female Adopting a healthy lifestyle and getting preventive care can go a long way to promote health and wellness. Talk with your health care provider about what schedule of regular examinations is right for you. This is a good chance for you to check in with your provider about disease prevention and staying healthy. In between checkups, there are plenty of things you can do on your own. Experts have done a lot of research about which lifestyle changes and preventive measures are most likely to keep you healthy. Ask your health care provider for more information. Weight and diet Eat a healthy diet  Be sure to include plenty of vegetables, fruits, low-fat dairy products, and lean protein.  Do not eat a lot of foods high in solid fats, added sugars, or salt.  Get regular exercise. This is one of the most important things you can do for your health.  Most adults should exercise for at least 150 minutes each week. The exercise should increase your heart rate and make you sweat (moderate-intensity exercise).  Most adults should also do strengthening exercises at least twice a week. This is in addition to the moderate-intensity exercise. Maintain a healthy weight  Body mass index (BMI) is a measurement that can be used to identify possible weight problems. It estimates body fat based on height and weight. Your health care provider can help determine your BMI and help you achieve or maintain a healthy weight.  For females 76 years of age and older:  A BMI below 18.5 is considered underweight.  A BMI of 18.5 to 24.9 is normal.  A BMI of 25 to 29.9 is considered overweight.  A BMI of 30 and above is considered obese. Watch levels of cholesterol and blood lipids  You should start having your blood tested for lipids and cholesterol at 64 years of age, then have this test every 5 years.  You may need to have your cholesterol levels checked more often if:  Your lipid or  cholesterol levels are high.  You are older than 64 years of age.  You are at high risk for heart disease. Cancer screening Lung Cancer  Lung cancer screening is recommended for adults 64-42 years old who are at high risk for lung cancer because of a history of smoking.  A yearly low-dose CT scan of the lungs is recommended for people who:  Currently smoke.  Have quit within the past 15 years.  Have at least a 30-pack-year history of smoking. A pack year is smoking an average of one pack of cigarettes a day for 1 year.  Yearly screening should continue until it has been 15 years since you quit.  Yearly screening should stop if you develop a health problem that would prevent you from having lung cancer treatment. Breast Cancer  Practice breast self-awareness. This means understanding how your breasts normally appear and feel.  It also means doing regular breast self-exams. Let your health care provider know about any changes, no matter how small.  If you are in your 20s or 30s, you should have a clinical breast exam (CBE) by a health care provider every 1-3 years as part of a regular health exam.  If you are 34 or older, have a CBE every year. Also consider having a breast X-ray (mammogram) every year.  If you have a family history of breast cancer, talk to your health care provider about genetic screening.  If you are at high risk for breast cancer, talk  to your health care provider about having an MRI and a mammogram every year.  Breast cancer gene (BRCA) assessment is recommended for women who have family members with BRCA-related cancers. BRCA-related cancers include:  Breast.  Ovarian.  Tubal.  Peritoneal cancers.  Results of the assessment will determine the need for genetic counseling and BRCA1 and BRCA2 testing. Cervical Cancer  Your health care provider may recommend that you be screened regularly for cancer of the pelvic organs (ovaries, uterus, and vagina).  This screening involves a pelvic examination, including checking for microscopic changes to the surface of your cervix (Pap test). You may be encouraged to have this screening done every 3 years, beginning at age 24.  For women ages 66-65, health care providers may recommend pelvic exams and Pap testing every 3 years, or they may recommend the Pap and pelvic exam, combined with testing for human papilloma virus (HPV), every 5 years. Some types of HPV increase your risk of cervical cancer. Testing for HPV may also be done on women of any age with unclear Pap test results.  Other health care providers may not recommend any screening for nonpregnant women who are considered low risk for pelvic cancer and who do not have symptoms. Ask your health care provider if a screening pelvic exam is right for you.  If you have had past treatment for cervical cancer or a condition that could lead to cancer, you need Pap tests and screening for cancer for at least 20 years after your treatment. If Pap tests have been discontinued, your risk factors (such as having a new sexual partner) need to be reassessed to determine if screening should resume. Some women have medical problems that increase the chance of getting cervical cancer. In these cases, your health care provider may recommend more frequent screening and Pap tests. Colorectal Cancer  This type of cancer can be detected and often prevented.  Routine colorectal cancer screening usually begins at 64 years of age and continues through 64 years of age.  Your health care provider may recommend screening at an earlier age if you have risk factors for colon cancer.  Your health care provider may also recommend using home test kits to check for hidden blood in the stool.  A small camera at the end of a tube can be used to examine your colon directly (sigmoidoscopy or colonoscopy). This is done to check for the earliest forms of colorectal cancer.  Routine  screening usually begins at age 41.  Direct examination of the colon should be repeated every 5-10 years through 64 years of age. However, you may need to be screened more often if early forms of precancerous polyps or small growths are found. Skin Cancer  Check your skin from head to toe regularly.  Tell your health care provider about any new moles or changes in moles, especially if there is a change in a mole's shape or color.  Also tell your health care provider if you have a mole that is larger than the size of a pencil eraser.  Always use sunscreen. Apply sunscreen liberally and repeatedly throughout the day.  Protect yourself by wearing long sleeves, pants, a wide-brimmed hat, and sunglasses whenever you are outside. Heart disease, diabetes, and high blood pressure  High blood pressure causes heart disease and increases the risk of stroke. High blood pressure is more likely to develop in:  People who have blood pressure in the high end of the normal range (130-139/85-89 mm Hg).  People who are overweight or obese.  People who are African American.  If you are 59-24 years of age, have your blood pressure checked every 3-5 years. If you are 34 years of age or older, have your blood pressure checked every year. You should have your blood pressure measured twice-once when you are at a hospital or clinic, and once when you are not at a hospital or clinic. Record the average of the two measurements. To check your blood pressure when you are not at a hospital or clinic, you can use:  An automated blood pressure machine at a pharmacy.  A home blood pressure monitor.  If you are between 29 years and 60 years old, ask your health care provider if you should take aspirin to prevent strokes.  Have regular diabetes screenings. This involves taking a blood sample to check your fasting blood sugar level.  If you are at a normal weight and have a low risk for diabetes, have this test once  every three years after 64 years of age.  If you are overweight and have a high risk for diabetes, consider being tested at a younger age or more often. Preventing infection Hepatitis B  If you have a higher risk for hepatitis B, you should be screened for this virus. You are considered at high risk for hepatitis B if:  You were born in a country where hepatitis B is common. Ask your health care provider which countries are considered high risk.  Your parents were born in a high-risk country, and you have not been immunized against hepatitis B (hepatitis B vaccine).  You have HIV or AIDS.  You use needles to inject street drugs.  You live with someone who has hepatitis B.  You have had sex with someone who has hepatitis B.  You get hemodialysis treatment.  You take certain medicines for conditions, including cancer, organ transplantation, and autoimmune conditions. Hepatitis C  Blood testing is recommended for:  Everyone born from [birth year] through 1963.  Anyone with known risk factors for hepatitis C. Sexually transmitted infections (STIs)  You should be screened for sexually transmitted infections (STIs) including gonorrhea and chlamydia if:  You are sexually active and are younger than 64 years of age.  You are older than 64 years of age and your health care provider tells you that you are at risk for this type of infection.  Your sexual activity has changed since you were last screened and you are at an increased risk for chlamydia or gonorrhea. Ask your health care provider if you are at risk.  If you do not have HIV, but are at risk, it may be recommended that you take a prescription medicine daily to prevent HIV infection. This is called pre-exposure prophylaxis (PrEP). You are considered at risk if:  You are sexually active and do not regularly use condoms or know the HIV status of your partner(s).  You take drugs by injection.  You are sexually active with a partner  who has HIV. Talk with your health care provider about whether you are at high risk of being infected with HIV. If you choose to begin PrEP, you should first be tested for HIV. You should then be tested every 3 months for as long as you are taking PrEP. Pregnancy  If you are premenopausal and you may become pregnant, ask your health care provider about preconception counseling.  If you may become pregnant, take 400 to 800 micrograms (mcg) of folic acid  every day.  If you want to prevent pregnancy, talk to your health care provider about birth control (contraception). Osteoporosis and menopause  Osteoporosis is a disease in which the bones lose minerals and strength with aging. This can result in serious bone fractures. Your risk for osteoporosis can be identified using a bone density scan.  If you are 4 years of age or older, or if you are at risk for osteoporosis and fractures, ask your health care provider if you should be screened.  Ask your health care provider whether you should take a calcium or vitamin D supplement to lower your risk for osteoporosis.  Menopause may have certain physical symptoms and risks.  Hormone replacement therapy may reduce some of these symptoms and risks. Talk to your health care provider about whether hormone replacement therapy is right for you. Follow these instructions at home:  Schedule regular health, dental, and eye exams.  Stay current with your immunizations.  Do not use any tobacco products including cigarettes, chewing tobacco, or electronic cigarettes.  If you are pregnant, do not drink alcohol.  If you are breastfeeding, limit how much and how often you drink alcohol.  Limit alcohol intake to no more than 1 drink per day for nonpregnant women. One drink equals 12 ounces of beer, 5 ounces of wine, or 1 ounces of hard liquor.  Do not use street drugs.  Do not share needles.  Ask your health care provider for help if you need support  or information about quitting drugs.  Tell your health care provider if you often feel depressed.  Tell your health care provider if you have ever been abused or do not feel safe at home. This information is not intended to replace advice given to you by your health care provider. Make sure you discuss any questions you have with your health care provider. Document Released: 01/23/2011 Document Revised: 12/16/2015 Document Reviewed: 04/13/2015 Elsevier Interactive Patient Education  2017 [employer].

## 2016-12-04 NOTE — Progress Notes (Signed)
Patient: Robin Warren, Female    DOB: 24-Mar-1951, 66 y.o.   MRN: [id number] Visit Date: 12/04/2016  Today's Provider: Vernie Murders, PA   Chief Complaint  Patient presents with  . Annual Exam   Subjective:    Annual physical exam Robin Warren is a 66 y.o. female who presents today for health maintenance and complete physical. She feels well. She reports exercising at least 2 days a week. She reports she is sleeping well.     Review of Systems  Constitutional: Negative.   HENT: Negative.   Eyes: Negative.   Respiratory: Negative.   Cardiovascular: Negative.   Gastrointestinal: Negative.   Endocrine: Negative.   Genitourinary: Negative.   Musculoskeletal: Negative.   Skin: Negative.   Allergic/Immunologic: Negative.   Neurological: Negative.   Hematological: Negative.   Psychiatric/Behavioral: Negative.     Social History      She  reports that she has never smoked. She has never used smokeless tobacco. She reports that she drinks alcohol. She reports that she does not use drugs.       Social History   Social History  . Marital status: Single    Spouse name: N/A  . Number of children: N/A  . Years of education: N/A   Social History Main Topics  . Smoking status: Never Smoker  . Smokeless tobacco: Never Used  . Alcohol use 0.0 oz/week     Comment: OCCASIONALLY  . Drug use: No  . Sexual activity: Not Asked   Other Topics Concern  . None   Social History Narrative  . None    No past medical history on file.   Patient Active Problem List   Diagnosis Date Noted  . Thoracic scoliosis 04/12/2015  . Acid indigestion 03/10/2015  . Abnormal craving 03/10/2015  . Avitaminosis D 12/27/2009  . Menopausal symptom 09/29/2005    Past Surgical History:  Procedure Laterality Date  . NASOLACRIMAL DUCT PROBING W/ INSERTION OF STENT  2005    Family History        Family Status  Relation Status  . Mother Deceased  . MGM Deceased  . MGF Deceased    . PGM Deceased  . PGF Deceased  . Sister Alive        Her family history includes Healthy in her sister; Heart attack in her paternal grandfather; Heart failure in her maternal grandfather and paternal grandfather; Parkinson's disease in her paternal grandmother; Stroke in her maternal grandmother and paternal grandfather.     Allergies  Allergen Reactions  . Aspirin     Stomach bleed     Current Outpatient Prescriptions:  Marland Kitchen  MULTIPLE VITAMIN PO, Take 1 tablet by mouth daily., Disp: , Rfl:  .  ranitidine (ZANTAC) 75 MG tablet, Take 1 tablet by mouth as needed., Disp: , Rfl:    Patient Care Team: Alecia Doi, Vickki Muff, PA as PCP - General (Physician Assistant)      Objective:   Vitals: BP 124/78 (BP Location: Right Arm, Patient Position: Sitting, Cuff Size: Normal)   Pulse 76   Temp 98.3 F (36.8 C)   Resp 16   Ht 5' 7.5" (1.715 m)   Wt 145 lb (65.8 kg)   SpO2 97%   BMI 22.38 kg/m  Wt Readings from Last 3 Encounters:  12/04/16 145 lb (65.8 kg)  02/22/16 148 lb (67.1 kg)  01/10/16 148 lb (67.1 kg)     Vitals:   12/04/16 1508  BP:  124/78  Pulse: 76  Resp: 16  Temp: 98.3 F (36.8 C)  SpO2: 97%  Weight: 145 lb (65.8 kg)  Height: 5' 7.5" (1.715 m)     Physical Exam  Constitutional: She is oriented to person, place, and time. She appears well-developed and well-nourished.  HENT:  Head: Normocephalic and atraumatic.  Right Ear: External ear normal.  Left Ear: External ear normal.  Nose: Nose normal.  Mouth/Throat: Oropharynx is clear and moist.  Eyes: Conjunctivae and EOM are normal. Pupils are equal, round, and reactive to light. Right eye exhibits no discharge.  Neck: Normal range of motion. Neck supple. No tracheal deviation present. No thyromegaly present.  Cardiovascular: Normal rate, regular rhythm, normal heart sounds and intact distal pulses.   No murmur heard. Pulmonary/Chest: Effort normal and breath sounds normal. No respiratory distress. She has no  wheezes. She has no rales. She exhibits no tenderness.  Abdominal: Soft. She exhibits no distension and no mass. There is no tenderness. There is no rebound and no guarding.  Genitourinary:  Genitourinary Comments: Exam, PAP, mammogram and BMD done by GYN in [place]. Records not found for review.  Musculoskeletal: Normal range of motion. She exhibits deformity. She exhibits no edema or tenderness.  Unchanged thoracic scoliosis with concavity to the left.   Lymphadenopathy:    She has no cervical adenopathy.  Neurological: She is alert and oriented to person, place, and time. She has normal reflexes. No cranial nerve deficit. She exhibits normal muscle tone. Coordination normal.  Skin: Skin is warm and dry. No rash noted. No erythema.  Psychiatric: She has a normal mood and affect. Her behavior is normal. Judgment and thought content normal.   Depression Screen PHQ 2/9 Scores 12/04/2016  PHQ - 2 Score 0  PHQ- 9 Score 0    Assessment & Plan:     Routine Health Maintenance and Physical Exam  Exercise Activities and Dietary recommendations Goals    Goes to workout with a trainer at the gym - encouraged to go 3 days a week.      Immunization History  Administered Date(s) Administered  . Influenza,inj,Quad PF,36+ Mos 07/08/2016  . Influenza-Unspecified 05/23/2015  . Tdap 10/23/2011  . Zoster 01/31/2013    Health Maintenance  Topic Date Due  . Hepatitis C Screening  Jan 19, 1951  . HIV Screening  11/25/1967  . PAP SMEAR  11/24/1973  . COLONOSCOPY  11/25/2002  . MAMMOGRAM  11/29/2013  . INFLUENZA VACCINE  02/21/2017  . TETANUS/TDAP  10/22/2021     Discussed health benefits of physical activity, and encouraged her to engage in regular exercise appropriate for her age and condition.    1. Annual physical exam Very good general health. Had Zostavax 01-31-13, Hep C antibody negative on 02-04-14 and tetanus booster 10-23-11. States her GYN in [place] gets PAP and mammograms with  BMD prn. Also, declines colonoscopy but states she has had a Cologuard test in the past couple years that was negative. Given anticipatory counseling and will get routine labs. - CBC with Differential/Platelet - Comprehensive metabolic panel - Lipid panel - TSH  2. Thoracogenic scoliosis of thoracic region Unchanged concavity to the left without back pain. Congenital.  3. TMJ syndrome Minimal ache intermittently since following rehab exercises and precautions recommended by oral surgeon. Feels much improved.  4. Encounter for screening for HIV - HIV antibody     Vernie Murders, PA  [facility]

## 2017-01-04 ENCOUNTER — Ambulatory Visit
Admission: RE | Admit: 2017-01-04 | Discharge: 2017-01-04 | Disposition: A | Discharge status: Home / Self Care | Payer: BLUE CROSS/BLUE SHIELD | Source: Ambulatory Visit | Attending: Family Medicine | Admitting: Family Medicine

## 2017-01-04 ENCOUNTER — Encounter: Payer: Self-pay | Admitting: Family Medicine

## 2017-01-04 ENCOUNTER — Telehealth: Payer: Self-pay

## 2017-01-04 ENCOUNTER — Ambulatory Visit (INDEPENDENT_AMBULATORY_CARE_PROVIDER_SITE_OTHER): Payer: BLUE CROSS/BLUE SHIELD | Admitting: Family Medicine

## 2017-01-04 VITALS — BP 140/80 | HR 80 | Temp 98.3°F | Resp 16 | Wt 149.0 lb

## 2017-01-04 DIAGNOSIS — W1830XA Fall on same level, unspecified, initial encounter: Secondary | ICD-10-CM | POA: Diagnosis not present

## 2017-01-04 DIAGNOSIS — M25561 Pain in right knee: Secondary | ICD-10-CM

## 2017-01-04 DIAGNOSIS — M7989 Other specified soft tissue disorders: Secondary | ICD-10-CM | POA: Insufficient documentation

## 2017-01-04 NOTE — Progress Notes (Signed)
   Robin Warren  MRN: [id number] DOB: [birth year]  Subjective:  HPI   The patient is a 66 year old female who presents for evaluation of right knee pain.  She states she fell in a parking lot 1 week ago.  She did not seek any medical attention at the time.  She states that the knees is stiff, swollen and sore.  Weight bearing has not effected the pain however movement does.  She feels she can not bend the knee due to the swelling. Using ice packs and Ibuprofen 200 mg 2 tablets q 4-6 hours with Zantac 150 mg BID to prevent dyspepsia.  Patient Active Problem List   Diagnosis Date Noted  . Annual physical exam 12/04/2016  . 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 Thoracic scoliosis 04/12/2015  . Acid indigestion 03/10/2015  . Abnormal craving 03/10/2015  . Avitaminosis D 12/27/2009  . Menopausal symptom 09/29/2005    No past medical history on file.  Social History   Social History  . Marital status: Single    Spouse name: N/A  . Number of children: N/A  . Years of education: N/A   Occupational History  . Not on file.   Social History Main Topics  . Smoking status: Never Smoker  . Smokeless tobacco: Never Used  . Alcohol use 0.0 oz/week     Comment: OCCASIONALLY  . Drug use: No  . Sexual activity: Not on file   Other Topics Concern  . Not on file   Social History Narrative  . No narrative on file    Outpatient Encounter Prescriptions as of 01/04/2017  Medication Sig Note  . MULTIPLE VITAMIN PO Take 1 tablet by mouth daily. 03/10/2015: Received from: [place]: Take by mouth.  . ranitidine (ZANTAC) 75 MG tablet Take 1 tablet by mouth as needed. 03/10/2015: Medication taken as needed.  Received from: [place]: Take by mouth.   No facility-administered encounter medications on file as of 01/04/2017.     Allergies  Allergen Reactions  . Aspirin     Stomach bleed    Review of Systems  Constitutional: Negative for  chills and fever.  Musculoskeletal: Positive for falls and joint pain. Negative for back pain, myalgias and neck pain.    Objective:  BP 140/80 (BP Location: Right Arm, Patient Position: Sitting, Cuff Size: Normal)   Pulse 80   Temp 98.3 F (36.8 C) (Oral)   Resp 16   Wt 149 lb (67.6 kg)   BMI 22.99 kg/m   Physical Exam  Constitutional: She is well-developed, well-nourished, and in no distress.  Musculoskeletal: She exhibits tenderness.  Ecchymosis over the patella and along the medial side of the upper calf. Very tender patella with swelling superficial to the patella. Unable to flex the knee without very severe pain. No peripheral edema. Good quadriceps strength without neurovascular deficit.    Assessment and Plan :  1. Acute pain of right knee Very painful right knee over the patella for the past week after a fall in a parking lot in [place]. Ibuprofen helps discomfort but unable to flex knee. Will get x-ray to rule out patellar fracture and need for knee supporter/brace. Recheck pending report. - DG Knee Complete 4 Views Right

## 2017-01-04 NOTE — Telephone Encounter (Signed)
Patient advised.

## 2017-01-04 NOTE — Telephone Encounter (Signed)
-----   Message from Margo Common, [place] sent at 01/04/2017 12:56 PM EDT ----- No bony abnormality or fracture. Only swelling or hematoma over the right kneecap. Don't have to use a knee brace. Continue Ibuprofen as long as not irritating stomach beyond what the Zantac can control. Recommend some flexion exercises daily.

## 2017-01-09 LAB — CBC WITH DIFFERENTIAL/PLATELET
Basophils Absolute: 0 10*3/uL (ref 0.0–0.2)
Basos: 1 %
EOS (ABSOLUTE): 0.1 10*3/uL (ref 0.0–0.4)
Eos: 2 %
Hematocrit: 39.4 % (ref 34.0–46.6)
Hemoglobin: 12.8 g/dL (ref 11.1–15.9)
Immature Grans (Abs): 0 10*3/uL (ref 0.0–0.1)
Immature Granulocytes: 0 %
Lymphocytes Absolute: 1.4 10*3/uL (ref 0.7–3.1)
Lymphs: 24 %
MCH: 29.6 pg (ref 26.6–33.0)
MCHC: 32.5 g/dL (ref 31.5–35.7)
MCV: 91 fL (ref 79–97)
Monocytes Absolute: 0.5 10*3/uL (ref 0.1–0.9)
Monocytes: 8 %
Neutrophils Absolute: 3.7 10*3/uL (ref 1.4–7.0)
Neutrophils: 65 %
Platelets: 262 10*3/uL (ref 150–379)
RBC: 4.33 x10E6/uL (ref 3.77–5.28)
RDW: 13.5 % (ref 12.3–15.4)
WBC: 5.7 10*3/uL (ref 3.4–10.8)

## 2017-01-09 LAB — COMPREHENSIVE METABOLIC PANEL
ALT: 13 IU/L (ref 0–32)
AST: 18 IU/L (ref 0–40)
Albumin/Globulin Ratio: 2.1 (ref 1.2–2.2)
Albumin: 4.6 g/dL (ref 3.6–4.8)
Alkaline Phosphatase: 83 IU/L (ref 39–117)
BUN/Creatinine Ratio: 14 (ref 12–28)
BUN: 10 mg/dL (ref 8–27)
Bilirubin Total: 0.7 mg/dL (ref 0.0–1.2)
CO2: 26 mmol/L (ref 20–29)
Calcium: 9.7 mg/dL (ref 8.7–10.3)
Chloride: 103 mmol/L (ref 96–106)
Creatinine, Ser: 0.71 mg/dL (ref 0.57–1.00)
GFR calc Af Amer: 104 mL/min/{1.73_m2} (ref >59)
GFR calc non Af Amer: 90 mL/min/{1.73_m2} (ref >59)
Globulin, Total: 2.2 g/dL (ref 1.5–4.5)
Glucose: 93 mg/dL (ref 65–99)
Potassium: 4.4 mmol/L (ref 3.5–5.2)
Sodium: 144 mmol/L (ref 134–144)
Total Protein: 6.8 g/dL (ref 6.0–8.5)

## 2017-01-09 LAB — TSH: TSH: 1.5 u[IU]/mL (ref 0.450–4.500)

## 2017-01-09 LAB — LIPID PANEL
Chol/HDL Ratio: 2.3 ratio (ref 0.0–4.4)
Cholesterol, Total: 195 mg/dL (ref 100–199)
HDL: 84 mg/dL (ref >39)
LDL Calculated: 94 mg/dL (ref 0–99)
Triglycerides: 86 mg/dL (ref 0–149)
VLDL Cholesterol Cal: 17 mg/dL (ref 5–40)

## 2017-01-09 LAB — HIV ANTIBODY (ROUTINE TESTING W REFLEX): HIV Screen 4th Generation wRfx: NONREACTIVE

## 2017-01-09 LAB — HEPATITIS C ANTIBODY: Hep C Virus Ab: <0.1 s/co ratio (ref 0.0–0.9)

## 2017-01-09 NOTE — Progress Notes (Signed)
Advised  ED 

## 2017-08-30 LAB — HM PAP SMEAR: HM Pap smear: NEGATIVE

## 2017-08-31 LAB — HM MAMMOGRAPHY

## 2017-09-24 ENCOUNTER — Other Ambulatory Visit: Payer: Self-pay

## 2017-09-24 ENCOUNTER — Other Ambulatory Visit: Payer: Self-pay | Admitting: Family Medicine

## 2017-09-24 DIAGNOSIS — E559 Vitamin D deficiency, unspecified: Secondary | ICD-10-CM

## 2017-09-24 DIAGNOSIS — R5381 Other malaise: Secondary | ICD-10-CM

## 2017-09-24 DIAGNOSIS — R5383 Other fatigue: Secondary | ICD-10-CM

## 2017-09-24 NOTE — Progress Notes (Signed)
General malaise the past couple weeks. Worried because mother started this way when her multiple myeloma was found. Has a history of vitamin D deficiency. Will check labs and schedule follow up pending reports. 

## 2017-09-25 ENCOUNTER — Telehealth: Payer: Self-pay

## 2017-09-25 LAB — COMPREHENSIVE METABOLIC PANEL
ALT: 19 IU/L (ref 0–32)
AST: 20 IU/L (ref 0–40)
Albumin/Globulin Ratio: 2 (ref 1.2–2.2)
Albumin: 4.6 g/dL (ref 3.6–4.8)
Alkaline Phosphatase: 78 IU/L (ref 39–117)
BUN/Creatinine Ratio: 14 (ref 12–28)
BUN: 10 mg/dL (ref 8–27)
Bilirubin Total: 0.4 mg/dL (ref 0.0–1.2)
CO2: 24 mmol/L (ref 20–29)
Calcium: 9.7 mg/dL (ref 8.7–10.3)
Chloride: 102 mmol/L (ref 96–106)
Creatinine, Ser: 0.69 mg/dL (ref 0.57–1.00)
GFR calc Af Amer: 106 mL/min/{1.73_m2} (ref >59)
GFR calc non Af Amer: 92 mL/min/{1.73_m2} (ref >59)
Globulin, Total: 2.3 g/dL (ref 1.5–4.5)
Glucose: 85 mg/dL (ref 65–99)
Potassium: 4.3 mmol/L (ref 3.5–5.2)
Sodium: 143 mmol/L (ref 134–144)
Total Protein: 6.9 g/dL (ref 6.0–8.5)

## 2017-09-25 LAB — CBC WITH DIFFERENTIAL/PLATELET
Basophils Absolute: 0.1 10*3/uL (ref 0.0–0.2)
Basos: 1 %
EOS (ABSOLUTE): 0.1 10*3/uL (ref 0.0–0.4)
Eos: 2 %
Hematocrit: 40.2 % (ref 34.0–46.6)
Hemoglobin: 13.3 g/dL (ref 11.1–15.9)
Immature Grans (Abs): 0 10*3/uL (ref 0.0–0.1)
Immature Granulocytes: 0 %
Lymphocytes Absolute: 1.7 10*3/uL (ref 0.7–3.1)
Lymphs: 27 %
MCH: 29.8 pg (ref 26.6–33.0)
MCHC: 33.1 g/dL (ref 31.5–35.7)
MCV: 90 fL (ref 79–97)
Monocytes Absolute: 0.5 10*3/uL (ref 0.1–0.9)
Monocytes: 8 %
Neutrophils Absolute: 3.9 10*3/uL (ref 1.4–7.0)
Neutrophils: 62 %
Platelets: 262 10*3/uL (ref 150–379)
RBC: 4.46 x10E6/uL (ref 3.77–5.28)
RDW: 13.6 % (ref 12.3–15.4)
WBC: 6.2 10*3/uL (ref 3.4–10.8)

## 2017-09-25 LAB — TSH: TSH: 1.55 u[IU]/mL (ref 0.450–4.500)

## 2017-09-25 LAB — VITAMIN D 25 HYDROXY (VIT D DEFICIENCY, FRACTURES): Vit D, 25-Hydroxy: 47.2 ng/mL (ref 30.0–100.0)

## 2017-09-25 NOTE — Telephone Encounter (Signed)
-----   Message from Margo Common, [place] sent at 09/25/2017  8:33 AM EST ----- All labs perfectly normal. Schedule initial Medicare Wellness Screening Exam this Summer or Fall.

## 2017-09-25 NOTE — Telephone Encounter (Signed)
LMTCB

## 2017-09-25 NOTE — Telephone Encounter (Signed)
Patient advised.

## 2018-01-02 DIAGNOSIS — R69 Illness, unspecified: Secondary | ICD-10-CM | POA: Diagnosis not present

## 2018-01-18 ENCOUNTER — Other Ambulatory Visit: Payer: Self-pay | Admitting: Family Medicine

## 2018-01-18 DIAGNOSIS — H6501 Acute serous otitis media, right ear: Secondary | ICD-10-CM

## 2018-01-18 MED ORDER — AMOXICILLIN 875 MG PO TABS
875.0000 mg | ORAL_TABLET | Freq: Two times a day (BID) | ORAL | 0 refills | Status: DC
Start: 2018-01-18 — End: 2018-01-29

## 2018-01-18 NOTE — Progress Notes (Signed)
Dull reflex and slight redness of right TM with intermittent dizziness. May continue Claritin-D, Flonase Nasal Spray and add Amoxil. Recheck ears in a week if no better.

## 2018-01-28 ENCOUNTER — Encounter: Payer: Self-pay | Admitting: Family Medicine

## 2018-01-29 ENCOUNTER — Encounter: Payer: Self-pay | Admitting: Family Medicine

## 2018-01-29 ENCOUNTER — Ambulatory Visit (INDEPENDENT_AMBULATORY_CARE_PROVIDER_SITE_OTHER): Payer: Medicare HMO | Admitting: Family Medicine

## 2018-01-29 VITALS — BP 118/62 | HR 93 | Temp 98.3°F | Ht 68.0 in | Wt 141.4 lb

## 2018-01-29 DIAGNOSIS — Z0000 Encounter for general adult medical examination without abnormal findings: Secondary | ICD-10-CM

## 2018-01-29 DIAGNOSIS — E559 Vitamin D deficiency, unspecified: Secondary | ICD-10-CM | POA: Diagnosis not present

## 2018-01-29 DIAGNOSIS — K30 Functional dyspepsia: Secondary | ICD-10-CM | POA: Diagnosis not present

## 2018-01-29 DIAGNOSIS — Z23 Encounter for immunization: Secondary | ICD-10-CM

## 2018-01-29 DIAGNOSIS — M4134 Thoracogenic scoliosis, thoracic region: Secondary | ICD-10-CM

## 2018-01-29 DIAGNOSIS — M26629 Arthralgia of temporomandibular joint, unspecified side: Secondary | ICD-10-CM

## 2018-01-29 LAB — POCT URINALYSIS DIPSTICK
Bilirubin, UA: NEGATIVE
Blood, UA: NEGATIVE
Glucose, UA: NEGATIVE
Ketones, UA: NEGATIVE
Leukocytes, UA: NEGATIVE
Nitrite, UA: NEGATIVE
Protein, UA: NEGATIVE
Spec Grav, UA: <=1.005 — AB (ref 1.010–1.025)
Urobilinogen, UA: 0.2 E.U./dL
pH, UA: 7.5 (ref 5.0–8.0)

## 2018-01-29 NOTE — Progress Notes (Signed)
Patient: Robin Warren, Female    DOB: 09/09/1950, 67 y.o.   MRN: [id number] Visit Date: 01/29/2018  Today's Provider: Vernie Murders, PA   Chief Complaint  Patient presents with  . Medicare Wellness   Subjective:   Initial preventative physical exam Robin Warren is a 67 y.o. female who presents today for her Initial Preventative Physical Exam. She feels well. She reports exercising 2 days per week. She reports she is sleeping well.   Review of Systems  Constitutional: Negative.   HENT: Negative.   Eyes: Negative.   Respiratory: Negative.   Cardiovascular: Negative.   Gastrointestinal: Negative.   Endocrine: Negative.   Genitourinary: Negative.   Musculoskeletal: Negative.   Skin: Negative.   Allergic/Immunologic: Negative.   Neurological: Negative.   Hematological: Negative.   Psychiatric/Behavioral: Negative.    Social History   Socioeconomic History  . Marital status: Single    Spouse name: Not on file  . Number of children: Not on file  . Years of education: Not on file  . Highest education level: Not on file  Occupational History  . Not on file  Social Needs  . Financial resource strain: Not on file  . Food insecurity:    Worry: Not on file    Inability: Not on file  . Transportation needs:    Medical: Not on file    Non-medical: Not on file  Tobacco Use  . Smoking status: Never Smoker  . Smokeless tobacco: Never Used  Substance and Sexual Activity  . Alcohol use: Yes    Alcohol/week: 0.0 oz    Comment: OCCASIONALLY  . Drug use: No  . Sexual activity: Not on file  Lifestyle  . Physical activity:    Days per week: Not on file    Minutes per session: Not on file  . Stress: Not on file  Relationships  . Social connections:    Talks on phone: Not on file    Gets together: Not on file    Attends religious service: Not on file    Active member of club or organization: Not on file    Attends meetings of clubs or organizations: Not on file   Relationship status: Not on file  . Intimate partner violence:    Fear of current or ex partner: Not on file    Emotionally abused: Not on file    Physically abused: Not on file    Forced sexual activity: Not on file  Other Topics Concern  . Not on file  Social History Narrative  . Not on file   History reviewed. No pertinent past medical history.  Patient Active Problem List   Diagnosis Date Noted  . Annual physical exam 12/04/2016  . 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 Thoracic scoliosis 04/12/2015  . Acid indigestion 03/10/2015  . Abnormal craving 03/10/2015  . Avitaminosis D 12/27/2009  . Menopausal symptom 09/29/2005   Past Surgical History:  Procedure Laterality Date  . NASOLACRIMAL DUCT PROBING W/ INSERTION OF STENT  2005    Her family history includes Healthy in her sister; Heart attack in her paternal grandfather; Heart failure in her maternal grandfather and paternal grandfather; Parkinson's disease in her paternal grandmother; Stroke in her maternal grandmother and paternal grandfather.      Current Outpatient Medications:  .  Cholecalciferol (VITAMIN D PO), Take by mouth., Disp: , Rfl:  .  loratadine (CLARITIN) 10 MG tablet, Take 10 mg by mouth daily., Disp: , Rfl:  .  MULTIPLE VITAMIN PO, Take 1 tablet by mouth daily., Disp: , Rfl:  .  ranitidine (ZANTAC) 75 MG tablet, Take 1 tablet by mouth as needed., Disp: , Rfl:    Patient Care Team: Chrismon, Vickki Muff, PA as PCP - General (Physician Assistant)      Objective:   Vitals: BP 118/62 (BP Location: Right Arm, Patient Position: Sitting, Cuff Size: Normal)   Pulse 93   Temp 98.3 F (36.8 C) (Oral)   Ht 5\' 8"  (1.727 m)   Wt 141 lb 6.4 oz (64.1 kg)   SpO2 97%   BMI 21.50 kg/m   Physical Exam  Constitutional: She is oriented to person, place, and time. She appears well-developed and well-nourished.  HENT:  Head: Normocephalic and atraumatic.  Right Ear: External ear normal.  Left Ear: External ear normal.    Nose: Nose normal.  Mouth/Throat: Oropharynx is clear and moist.  Eyes: Pupils are equal, round, and reactive to light. Conjunctivae and EOM are normal. Right eye exhibits no discharge.  Neck: Normal range of motion. Neck supple. No tracheal deviation present. No thyromegaly present.  Cardiovascular: Normal rate, regular rhythm, normal heart sounds and intact distal pulses.  No murmur heard. Pulmonary/Chest: Effort normal and breath sounds normal. No respiratory distress. She has no wheezes. She has no rales. She exhibits no tenderness.  Abdominal: Soft. She exhibits no distension and no mass. There is no tenderness. There is no rebound and no guarding.  Genitourinary:  Genitourinary Comments: Deferred to GYN at annual exam in February 2020.  Musculoskeletal: Normal range of motion. She exhibits no edema or tenderness.  Thoracic scoliosis with concavity to the left. Good ROM.  Lymphadenopathy:    She has no cervical adenopathy.  Neurological: She is alert and oriented to person, place, and time. She has normal reflexes. She displays normal reflexes. No cranial nerve deficit. She exhibits normal muscle tone. Coordination normal.  Skin: Skin is warm and dry. No rash noted. No erythema.  Psychiatric: She has a normal mood and affect. Her behavior is normal. Judgment and thought content normal.       Activities of Daily Living In your present state of health, do you have any difficulty performing the following activities: 01/29/2018  Hearing? N  Vision? N  Difficulty concentrating or making decisions? N  Walking or climbing stairs? N  Dressing or bathing? N  Doing errands, shopping? N  Some recent data might be hidden    Fall Risk Assessment Fall Risk  01/29/2018 12/04/2016  Falls in the past year? No No     Depression Screen PHQ 2/9 Scores 01/29/2018 12/04/2016  PHQ - 2 Score 0 0  PHQ- 9 Score 0 0    Cognitive Testing - 6-CIT  Correct? Score   What year is it? yes 0 0 or 4  What  month is it? yes 0 0 or 3  Memorize:    Pia Mau,  44,  [place],      What time is it? (within 1 hour) yes 0 0 or 3  Count backwards from 20 yes 0 0, 2, or 4  Name the months of the year yes 0 0, 2, or 4  Repeat name & address above yes 1 0, 2, 4, 6, 8, or 10       TOTAL SCORE  1/28   Interpretation:  Normal  Normal (0-7) Abnormal (8-28)    Assessment & Plan:     Initial Preventative Physical Exam  Reviewed patient's Family Medical History Reviewed and updated list of patient's medical providers Assessment of cognitive impairment was done Assessed patient's functional ability Established a written schedule for health screening [place] Completed and Reviewed  Exercise Activities and Dietary recommendations Goals    Encouraged to continue regular exercise for 30 minutes 3-4 days a week.      Immunization History  Administered Date(s) Administered  . Influenza,inj,Quad PF,6+ Mos 07/08/2016  . Influenza-Unspecified 05/23/2015, 06/18/2017  . Tdap 10/23/2011  . Zoster 01/31/2013    Health Maintenance  Topic Date Due  . PAP SMEAR  11/24/1973  . COLONOSCOPY  11/25/2002  . MAMMOGRAM  11/29/2013  . PNA vac Low Risk Adult (1 of 2 - PCV13) 11/24/2017  . INFLUENZA VACCINE  02/21/2018  . TETANUS/TDAP  10/22/2021  . DEXA SCAN  Completed  . Hepatitis C Screening  Completed  . HIV Screening  Completed     Discussed health benefits of physical activity, and encouraged her to engage in regular exercise appropriate for her age and condition.    ------------------------------------------------------------------------------------------------------------ 1. Initial Medicare annual wellness visit General health stable. Continues to exercise with a personal trainer 2-3 times a week. To up date Prevnar-58 today. Wants to consider colonoscopy or repeat Cologuard in the near future. EKG shows short PR syndrome without chest pains, dyspnea or  palpitations. Check routine labs and given anticipatory counseling. - EKG 12-Lead - POCT Urinalysis Dipstick - CBC with Differential/Platelet - Comprehensive metabolic panel - Lipid panel - TSH  2. TMJ syndrome Asymptomatic clicking in the left TM joint today. Occasionally has significant pain and near locking. Relieved by rest and Ibuprofen.  - CBC with Differential/Platelet  3. Acid indigestion Infrequent occurrences. Well controlled with use of Zantac prn. No hematemesis, hematochezia or melena. Check routine labs. - CBC with Differential/Platelet - Comprehensive metabolic panel  4. Avitaminosis D Still taking Vitamin-D supplement. Planning to get BMD up date with GYN in Feb. 2020. Check CBC, CMP and Vitamin D level. - CBC with Differential/Platelet - Comprehensive metabolic panel - VITAMIN D 25 Hydroxy (Vit-D Deficiency, Fractures)  5. Thoracogenic scoliosis of thoracic region Unchanged. Good ROM of spine without pain.  6. Need for vaccination against Streptococcus pneumoniae - Pneumococcal conjugate vaccine 13-valent IM    Vernie Murders, PA  [facility]

## 2018-02-01 DIAGNOSIS — E559 Vitamin D deficiency, unspecified: Secondary | ICD-10-CM | POA: Diagnosis not present

## 2018-02-01 DIAGNOSIS — K30 Functional dyspepsia: Secondary | ICD-10-CM | POA: Diagnosis not present

## 2018-02-01 DIAGNOSIS — Z0000 Encounter for general adult medical examination without abnormal findings: Secondary | ICD-10-CM | POA: Diagnosis not present

## 2018-02-01 DIAGNOSIS — M26629 Arthralgia of temporomandibular joint, unspecified side: Secondary | ICD-10-CM | POA: Diagnosis not present

## 2018-02-02 LAB — CBC WITH DIFFERENTIAL/PLATELET
Basophils Absolute: 0 10*3/uL (ref 0.0–0.2)
Basos: 1 %
EOS (ABSOLUTE): 0.1 10*3/uL (ref 0.0–0.4)
Eos: 2 %
Hematocrit: 40 % (ref 34.0–46.6)
Hemoglobin: 13.3 g/dL (ref 11.1–15.9)
Immature Grans (Abs): 0 10*3/uL (ref 0.0–0.1)
Immature Granulocytes: 0 %
Lymphocytes Absolute: 1.3 10*3/uL (ref 0.7–3.1)
Lymphs: 22 %
MCH: 30.2 pg (ref 26.6–33.0)
MCHC: 33.3 g/dL (ref 31.5–35.7)
MCV: 91 fL (ref 79–97)
Monocytes Absolute: 0.5 10*3/uL (ref 0.1–0.9)
Monocytes: 9 %
Neutrophils Absolute: 3.7 10*3/uL (ref 1.4–7.0)
Neutrophils: 66 %
Platelets: 239 10*3/uL (ref 150–450)
RBC: 4.41 x10E6/uL (ref 3.77–5.28)
RDW: 13.4 % (ref 12.3–15.4)
WBC: 5.6 10*3/uL (ref 3.4–10.8)

## 2018-02-02 LAB — LIPID PANEL
Chol/HDL Ratio: 2.5 ratio (ref 0.0–4.4)
Cholesterol, Total: 186 mg/dL (ref 100–199)
HDL: 74 mg/dL (ref >39)
LDL Calculated: 100 mg/dL — ABNORMAL HIGH (ref 0–99)
Triglycerides: 59 mg/dL (ref 0–149)
VLDL Cholesterol Cal: 12 mg/dL (ref 5–40)

## 2018-02-02 LAB — COMPREHENSIVE METABOLIC PANEL
ALT: 14 IU/L (ref 0–32)
AST: 19 IU/L (ref 0–40)
Albumin/Globulin Ratio: 2.2 (ref 1.2–2.2)
Albumin: 4.4 g/dL (ref 3.6–4.8)
Alkaline Phosphatase: 72 IU/L (ref 39–117)
BUN/Creatinine Ratio: 14 (ref 12–28)
BUN: 10 mg/dL (ref 8–27)
Bilirubin Total: 0.4 mg/dL (ref 0.0–1.2)
CO2: 24 mmol/L (ref 20–29)
Calcium: 9.4 mg/dL (ref 8.7–10.3)
Chloride: 103 mmol/L (ref 96–106)
Creatinine, Ser: 0.72 mg/dL (ref 0.57–1.00)
GFR calc Af Amer: 102 mL/min/{1.73_m2} (ref >59)
GFR calc non Af Amer: 88 mL/min/{1.73_m2} (ref >59)
Globulin, Total: 2 g/dL (ref 1.5–4.5)
Glucose: 91 mg/dL (ref 65–99)
Potassium: 4.5 mmol/L (ref 3.5–5.2)
Sodium: 142 mmol/L (ref 134–144)
Total Protein: 6.4 g/dL (ref 6.0–8.5)

## 2018-02-02 LAB — TSH: TSH: 1.01 u[IU]/mL (ref 0.450–4.500)

## 2018-02-02 LAB — VITAMIN D 25 HYDROXY (VIT D DEFICIENCY, FRACTURES): Vit D, 25-Hydroxy: 41 ng/mL (ref 30.0–100.0)

## 2018-02-04 ENCOUNTER — Telehealth: Payer: Self-pay

## 2018-02-04 NOTE — Telephone Encounter (Signed)
-----   Message from Margo Common, [place] sent at 02/04/2018 12:41 PM EDT ----- All blood and urine tests are normal. Recheck annually.

## 2018-02-04 NOTE — Telephone Encounter (Signed)
Pt advised.   Thanks,   -Roemello Speyer  

## 2018-04-30 DIAGNOSIS — H2513 Age-related nuclear cataract, bilateral: Secondary | ICD-10-CM | POA: Diagnosis not present

## 2018-06-10 DIAGNOSIS — R69 Illness, unspecified: Secondary | ICD-10-CM | POA: Diagnosis not present

## 2018-09-21 IMAGING — CR DG KNEE COMPLETE 4+V*R*
1 series · 4 of 4 positions shown · non-contrast
Comparison: Right knee films of 12/15/2010

CLINICAL DATA: Fell 1 week ago with persistent pain and swelling
anteriorly

EXAM:
RIGHT KNEE - COMPLETE 4+ VIEW

[Series 1: dg knee complete 4 views right · 0.14mm/px · 4 of 4 slices shown]
[im 1/4]
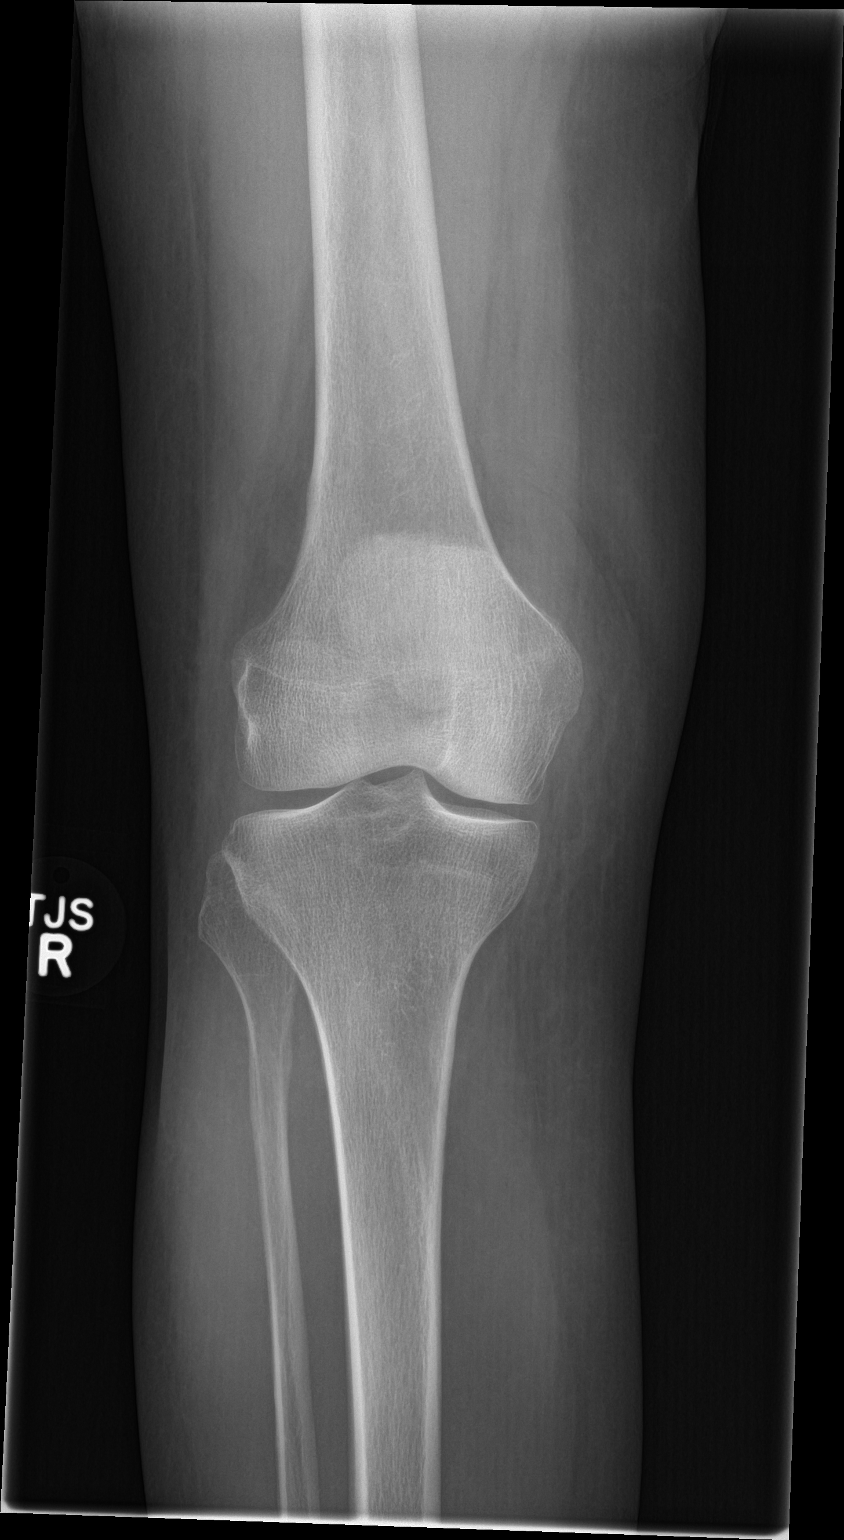
[im 2/4]
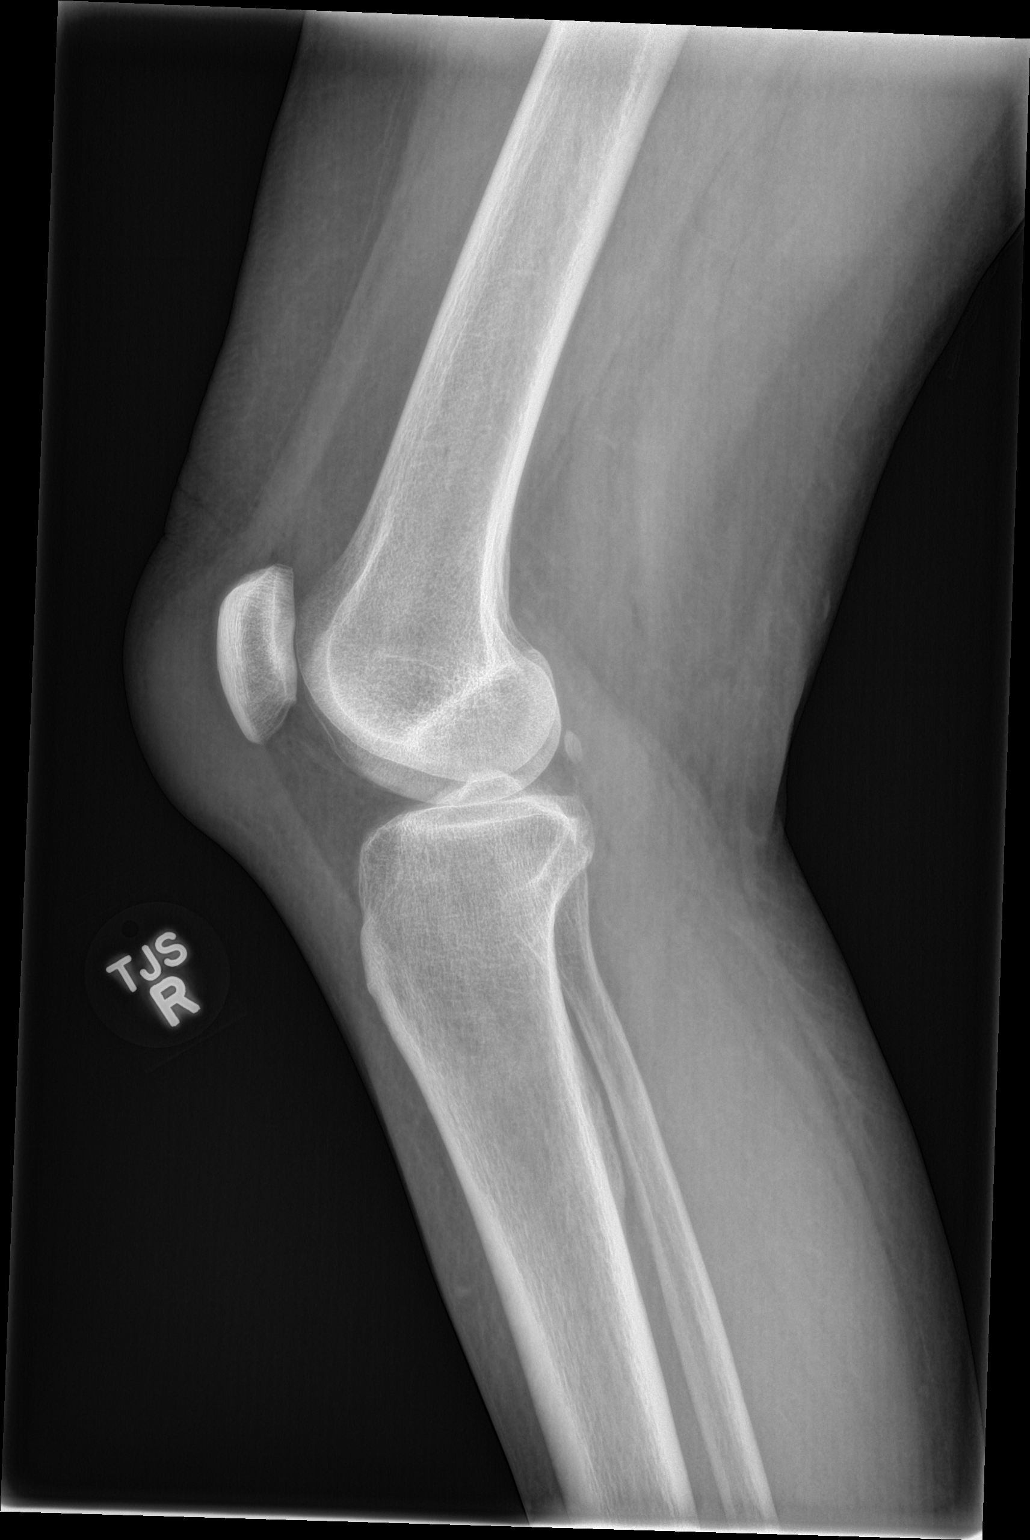
[im 3/4]
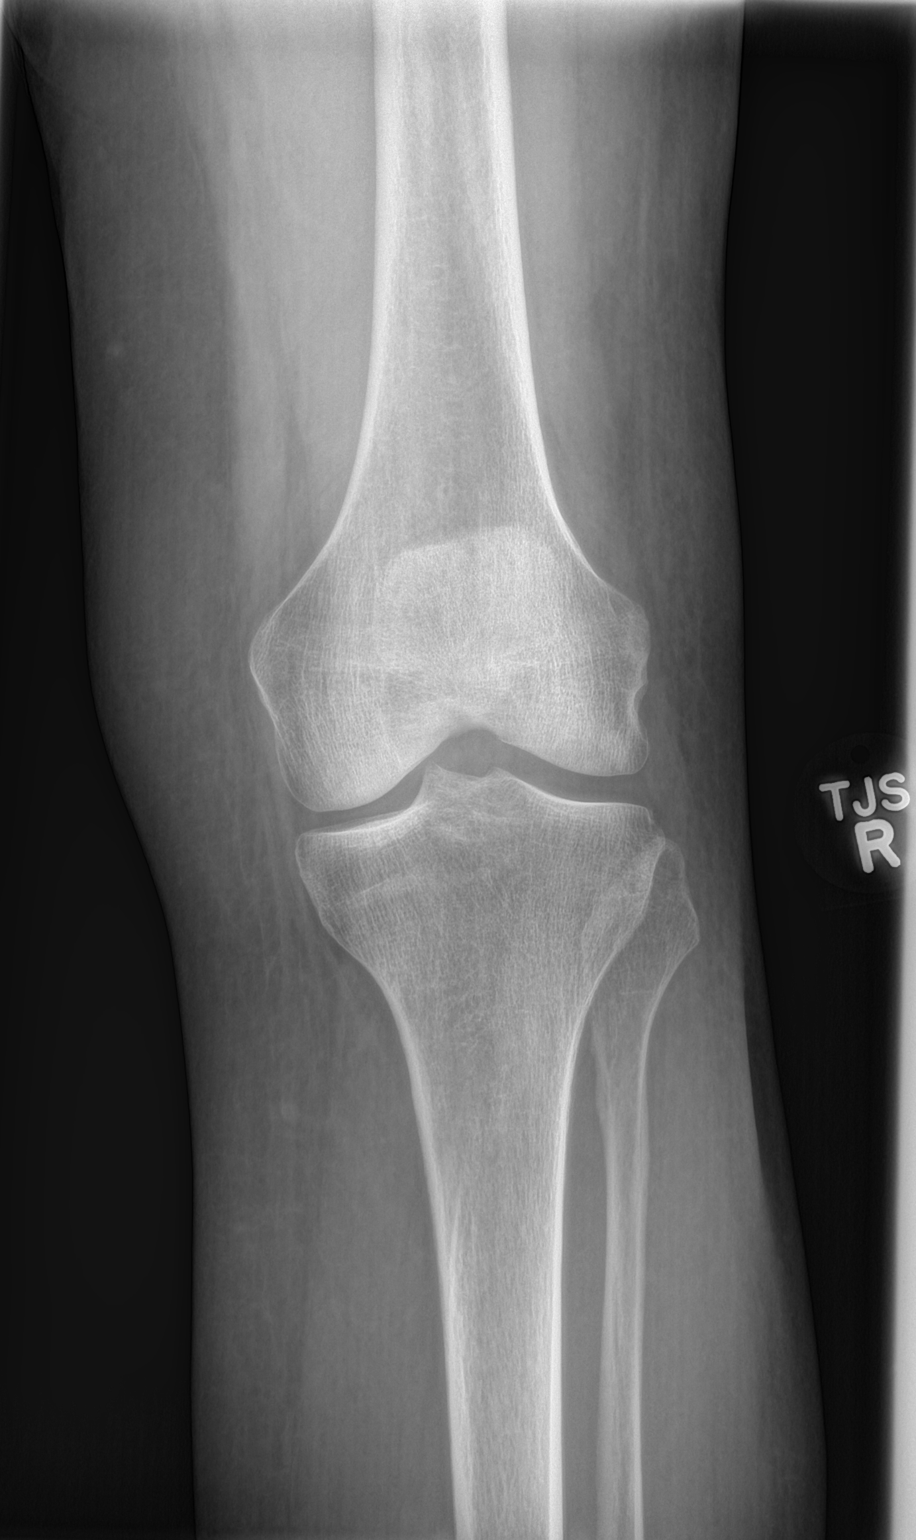
[im 4/4]
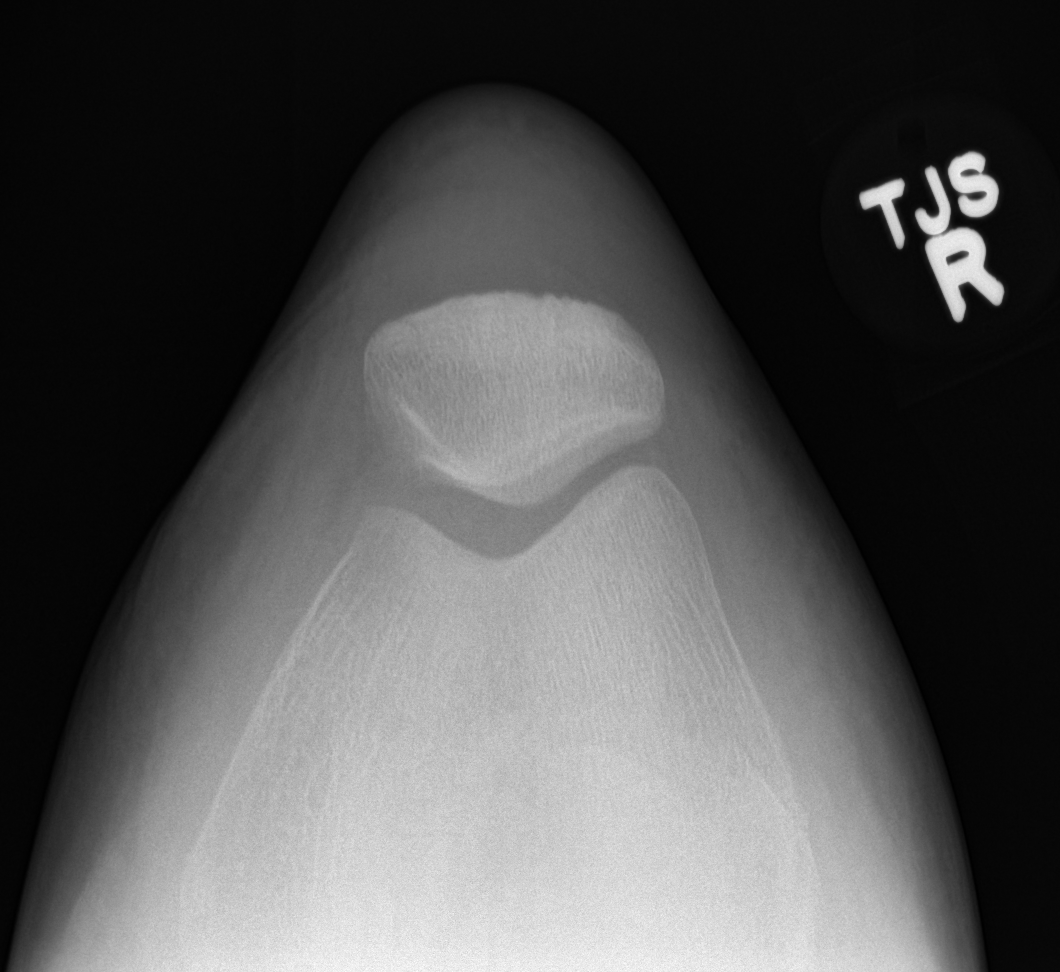

[4 of 4 positions shown; findings below may reference images not displayed]

FINDINGS: The right knee joint spaces appear relatively well preserved for
age. No fracture seen and no knee joint effusion seen. However there
is soft tissue swelling anterior to the patella which may represent
hematoma or edema.
IMPRESSION: 1. No fracture or joint effusion.
2. Soft tissue swelling anterior to the patella may be due to
hematoma or edema.

## 2018-11-13 ENCOUNTER — Encounter: Payer: Self-pay | Admitting: Family Medicine

## 2018-11-13 DIAGNOSIS — Z6822 Body mass index (BMI) 22.0-22.9, adult: Secondary | ICD-10-CM | POA: Diagnosis not present

## 2018-11-13 DIAGNOSIS — Z01419 Encounter for gynecological examination (general) (routine) without abnormal findings: Secondary | ICD-10-CM | POA: Diagnosis not present

## 2018-11-13 DIAGNOSIS — Z1231 Encounter for screening mammogram for malignant neoplasm of breast: Secondary | ICD-10-CM | POA: Diagnosis not present

## 2019-01-14 DIAGNOSIS — R69 Illness, unspecified: Secondary | ICD-10-CM | POA: Diagnosis not present

## 2019-04-15 ENCOUNTER — Ambulatory Visit (INDEPENDENT_AMBULATORY_CARE_PROVIDER_SITE_OTHER): Payer: Medicare HMO | Admitting: Family Medicine

## 2019-04-15 ENCOUNTER — Encounter: Payer: Self-pay | Admitting: Family Medicine

## 2019-04-15 ENCOUNTER — Other Ambulatory Visit: Payer: Self-pay

## 2019-04-15 VITALS — BP 132/78 | HR 85 | Temp 97.1°F | Resp 16 | Wt 141.0 lb

## 2019-04-15 DIAGNOSIS — M26629 Arthralgia of temporomandibular joint, unspecified side: Secondary | ICD-10-CM | POA: Diagnosis not present

## 2019-04-15 DIAGNOSIS — Z23 Encounter for immunization: Secondary | ICD-10-CM | POA: Diagnosis not present

## 2019-04-15 DIAGNOSIS — E559 Vitamin D deficiency, unspecified: Secondary | ICD-10-CM

## 2019-04-15 DIAGNOSIS — M8588 Other specified disorders of bone density and structure, other site: Secondary | ICD-10-CM | POA: Diagnosis not present

## 2019-04-15 DIAGNOSIS — K30 Functional dyspepsia: Secondary | ICD-10-CM | POA: Diagnosis not present

## 2019-04-15 DIAGNOSIS — Z0000 Encounter for general adult medical examination without abnormal findings: Secondary | ICD-10-CM | POA: Diagnosis not present

## 2019-04-15 DIAGNOSIS — E7800 Pure hypercholesterolemia, unspecified: Secondary | ICD-10-CM | POA: Diagnosis not present

## 2019-04-15 DIAGNOSIS — M4134 Thoracogenic scoliosis, thoracic region: Secondary | ICD-10-CM

## 2019-04-15 NOTE — Progress Notes (Signed)
Patient: Robin Warren, Female    DOB: 1951-03-03, 68 y.o.   MRN: [id number] Visit Date: 04/15/2019  Today's Provider: Vernie Murders, PA   Chief Complaint  Patient presents with  . Annual Exam   Subjective:  Robin Warren is a 68 y.o. female who presents today for health maintenance and complete physical. She feels well. She reports exercising none. She reports she is sleeping well.  Immunization History  Administered Date(s) Administered  . Influenza,inj,Quad PF,6+ Mos 07/08/2016  . Influenza-Unspecified 05/23/2015, 06/18/2017  . Pneumococcal Conjugate-13 01/29/2018  . Tdap 10/23/2011  . Zoster 01/31/2013   08/31/17 Mammogram 08/30/17 Pap Smear-Gyn 11/07/11 Bone Density   Review of Systems  Constitutional: Negative.   HENT: Negative.   Eyes: Negative.   Respiratory: Negative.   Cardiovascular: Negative.   Gastrointestinal: Negative.   Endocrine: Negative.   Genitourinary: Negative.   Musculoskeletal: Negative.   Skin: Negative.   Allergic/Immunologic: Negative.   Neurological: Negative.   Hematological: Negative.   Psychiatric/Behavioral: Negative.    Social History   Socioeconomic History  . Marital status: Single    Spouse name: Not on file  . Number of children: Not on file  . Years of education: Not on file  . Highest education level: Not on file  Occupational History  . Not on file  Social Needs  . Financial resource strain: Not on file  . Food insecurity    Worry: Not on file    Inability: Not on file  . Transportation needs    Medical: Not on file    Non-medical: Not on file  Tobacco Use  . Smoking status: Never Smoker  . Smokeless tobacco: Never Used  Substance and Sexual Activity  . Alcohol use: Yes    Alcohol/week: 0.0 standard drinks    Comment: OCCASIONALLY  . Drug use: No  . Sexual activity: Not on file  Lifestyle  . Physical activity    Days per week: Not on file    Minutes per session: Not on file  . Stress: Not on file  Relationships   . Social Herbalist on phone: Not on file    Gets together: Not on file    Attends religious service: Not on file    Active member of club or organization: Not on file    Attends meetings of clubs or organizations: Not on file    Relationship status: Not on file  . Intimate partner violence    Fear of current or ex partner: Not on file    Emotionally abused: Not on file    Physically abused: Not on file    Forced sexual activity: Not on file  Other Topics Concern  . Not on file  Social History Narrative  . Not on file    Patient Active Problem List   Diagnosis Date Noted  . 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 Thoracic scoliosis 04/12/2015  . Acid indigestion 03/10/2015  . Abnormal craving 03/10/2015  . Avitaminosis D 12/27/2009  . Menopausal symptom 09/29/2005    Past Surgical History:  Procedure Laterality Date  . NASOLACRIMAL DUCT PROBING W/ INSERTION OF STENT  2005    Her family history includes Healthy in her sister; Heart attack in her paternal grandfather; Heart failure in her maternal grandfather and paternal grandfather; Parkinson's disease in her paternal grandmother; Stroke in her maternal grandmother and paternal grandfather.     Outpatient Encounter Medications as of 04/15/2019  Medication Sig  . BIOTIN PO Take by mouth.  Marland Kitchen  Cholecalciferol (VITAMIN D PO) Take by mouth.  Marland Kitchen ibuprofen (ADVIL) 100 MG tablet Take 100 mg by mouth every 6 (six) hours as needed for fever.  . loratadine (CLARITIN) 10 MG tablet Take 10 mg by mouth daily.  . MULTIPLE VITAMIN PO Take 1 tablet by mouth daily.  . [DISCONTINUED] ranitidine (ZANTAC) 75 MG tablet Take 1 tablet by mouth as needed.   No facility-administered encounter medications on file as of 04/15/2019.     Patient Care Team: Chrismon, Vickki Muff, PA as PCP - General (Physician Assistant)      Objective:   Vitals:  Vitals:   04/15/19 1451  BP: 132/78  Pulse: 85  Resp: 16  Temp: (!) 97.1 F (36.2 C)  SpO2: 99%   Weight: 141 lb (64 kg)    Physical Exam Constitutional:      Appearance: She is well-developed.  HENT:     Head: Normocephalic and atraumatic.     Right Ear: External ear normal.     Left Ear: External ear normal.     Nose: Nose normal.  Eyes:     General:        Right eye: No discharge.     Conjunctiva/sclera: Conjunctivae normal.     Pupils: Pupils are equal, round, and reactive to light.  Neck:     Musculoskeletal: Normal range of motion and neck supple.     Thyroid: No thyromegaly.     Trachea: No tracheal deviation.  Cardiovascular:     Rate and Rhythm: Normal rate and regular rhythm.     Heart sounds: Normal heart sounds. No murmur.  Pulmonary:     Effort: Pulmonary effort is normal. No respiratory distress.     Breath sounds: Normal breath sounds. No wheezing or rales.  Chest:     Chest wall: No tenderness.  Abdominal:     General: There is no distension.     Palpations: Abdomen is soft. There is no mass.     Tenderness: There is no abdominal tenderness. There is no guarding or rebound.  Genitourinary:    Comments: Deferred to GYN (Dr. Lynnette Caffey) - exam and PAP done 11-13-18. Musculoskeletal: Normal range of motion.        General: No tenderness.     Comments: Thoracic scoliosis with concavity to the left. Good ROM.   Lymphadenopathy:     Cervical: No cervical adenopathy.  Skin:    General: Skin is warm and dry.     Findings: No erythema or rash.  Neurological:     Mental Status: She is alert and oriented to person, place, and time.     Cranial Nerves: No cranial nerve deficit.     Motor: No abnormal muscle tone.     Coordination: Coordination normal.     Deep Tendon Reflexes: Reflexes are normal and symmetric. Reflexes normal.  Psychiatric:        Behavior: Behavior normal.        Thought Content: Thought content normal.        Judgment: Judgment normal.     Depression Screen PHQ 2/9 Scores 01/29/2018 12/04/2016  PHQ - 2 Score 0 0  PHQ- 9 Score 0 0      Office Visit from 04/15/2019 in [facility]  AUDIT-C Score  3     Fall Risk  04/15/2019 01/29/2018 12/04/2016  Falls in the past year? 0 No No  Number falls in past yr: 0 - -  Injury with Fall? 0 - -  Functional Status Survey: Is the patient deaf or have difficulty hearing?: No Does the patient have difficulty seeing, even when wearing glasses/contacts?: Yes Does the patient have difficulty concentrating, remembering, or making decisions?: No Does the patient have difficulty walking or climbing stairs?: No Does the patient have difficulty dressing or bathing?: No Does the patient have difficulty doing errands alone such as visiting a doctor's office or shopping?: No  Home Exercise  04/15/2019  Current Exercise Habits The patient does not participate in regular exercise at present    Assessment & Plan:     Routine Health Maintenance and Physical Exam  Exercise Activities and Dietary recommendations Goals   None     Immunization History  Administered Date(s) Administered  . Influenza,inj,Quad PF,6+ Mos 07/08/2016  . Influenza-Unspecified 05/23/2015, 06/18/2017  . Pneumococcal Conjugate-13 01/29/2018  . Tdap 10/23/2011  . Zoster 01/31/2013   Health Maintenance  Topic Date Due  . COLONOSCOPY  11/25/2002  . PNA vac Low Risk Adult (2 of 2 - PPSV23) 01/30/2019  . INFLUENZA VACCINE  02/22/2019  . MAMMOGRAM  09/01/2019  . TETANUS/TDAP  10/22/2021  . DEXA SCAN  Completed  . Hepatitis C Screening  Completed     Discussed health benefits of physical activity, and encouraged her to engage in regular exercise appropriate for her age and condition.   1. Medicare annual wellness visit, subsequent Good general health. Needs Pneumovax-23 immunization. Gets GYN exam, PAP and mammograms through her GYN (Dr. Janyce Llanos in [place]). Given anticipatory counseling and recheck routine labs.  2. TMJ syndrome Intermittent pain and near locking/click after eating  anything that is tough to chew. Does not chew gum or ice. May use Ibuprofen or Tylenol prn. Apply moist heat if needed. Check routine labs. May need to have follow up with oral surgeon if it worsens. - CBC with Differential/Platelet  3. Acid indigestion No hematemesis or melena. Dyspepsia has not recurred often with use of TUMS daily for calcium supplementation. Recheck CBC and CMP. - CBC with Differential/Platelet - Comprehensive metabolic panel  4. 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 Some low back discomfort. Scoliosis convexity to the left. Does not appear to be worsening. Good ROM. No pain to palpation.  5. Osteopenia of lumbar spine Documented on BMD this year. Recheck labs. - Comprehensive metabolic panel - VITAMIN D 25 Hydroxy (Vit-D Deficiency, Fractures)  6. Avitaminosis D BMD in April 2020 showed osteopenia. Still taking TUMS for calcium supplementation and Vitamin-D 1000 IU qd. Recheck blood levels and follow up pending reports. - CBC with Differential/Platelet - Comprehensive metabolic panel - TSH - VITAMIN D 25 Hydroxy (Vit-D Deficiency, Fractures)  7. Elevated LDL cholesterol level LDL was 100 in 2019. Trying to follow a low fat diet. Recommend she restart exercise program and will check labs. - CBC with Differential/Platelet - Comprehensive metabolic panel - Lipid panel - TSH  8. Need for vaccination against Streptococcus pneumoniae Needs pneumovax-23 this year. Wants to get it at her pharmacy in [place].

## 2019-04-17 DIAGNOSIS — R69 Illness, unspecified: Secondary | ICD-10-CM | POA: Diagnosis not present

## 2019-04-22 DIAGNOSIS — M8588 Other specified disorders of bone density and structure, other site: Secondary | ICD-10-CM | POA: Diagnosis not present

## 2019-04-22 DIAGNOSIS — E559 Vitamin D deficiency, unspecified: Secondary | ICD-10-CM | POA: Diagnosis not present

## 2019-04-22 DIAGNOSIS — M26629 Arthralgia of temporomandibular joint, unspecified side: Secondary | ICD-10-CM | POA: Diagnosis not present

## 2019-04-22 DIAGNOSIS — E781 Pure hyperglyceridemia: Secondary | ICD-10-CM | POA: Diagnosis not present

## 2019-04-22 DIAGNOSIS — I10 Essential (primary) hypertension: Secondary | ICD-10-CM | POA: Diagnosis not present

## 2019-04-22 DIAGNOSIS — Z23 Encounter for immunization: Secondary | ICD-10-CM | POA: Diagnosis not present

## 2019-04-22 DIAGNOSIS — K30 Functional dyspepsia: Secondary | ICD-10-CM | POA: Diagnosis not present

## 2019-04-23 LAB — CBC WITH DIFFERENTIAL/PLATELET
Basophils Absolute: 0.1 10*3/uL (ref 0.0–0.2)
Basos: 1 %
EOS (ABSOLUTE): 0.1 10*3/uL (ref 0.0–0.4)
Eos: 2 %
Hematocrit: 39.8 % (ref 34.0–46.6)
Hemoglobin: 13.4 g/dL (ref 11.1–15.9)
Immature Grans (Abs): 0 10*3/uL (ref 0.0–0.1)
Immature Granulocytes: 0 %
Lymphocytes Absolute: 1.7 10*3/uL (ref 0.7–3.1)
Lymphs: 31 %
MCH: 31.2 pg (ref 26.6–33.0)
MCHC: 33.7 g/dL (ref 31.5–35.7)
MCV: 93 fL (ref 79–97)
Monocytes Absolute: 0.5 10*3/uL (ref 0.1–0.9)
Monocytes: 8 %
Neutrophils Absolute: 3.2 10*3/uL (ref 1.4–7.0)
Neutrophils: 58 %
Platelets: 240 10*3/uL (ref 150–450)
RBC: 4.29 x10E6/uL (ref 3.77–5.28)
RDW: 12.7 % (ref 11.7–15.4)
WBC: 5.6 10*3/uL (ref 3.4–10.8)

## 2019-04-23 LAB — COMPREHENSIVE METABOLIC PANEL
ALT: 11 IU/L (ref 0–32)
AST: 21 IU/L (ref 0–40)
Albumin/Globulin Ratio: 2.2 (ref 1.2–2.2)
Albumin: 4.3 g/dL (ref 3.8–4.8)
Alkaline Phosphatase: 72 IU/L (ref 39–117)
BUN/Creatinine Ratio: 13 (ref 12–28)
BUN: 11 mg/dL (ref 8–27)
Bilirubin Total: 0.4 mg/dL (ref 0.0–1.2)
CO2: 24 mmol/L (ref 20–29)
Calcium: 9.5 mg/dL (ref 8.7–10.3)
Chloride: 103 mmol/L (ref 96–106)
Creatinine, Ser: 0.83 mg/dL (ref 0.57–1.00)
GFR calc Af Amer: 85 mL/min/{1.73_m2} (ref >59)
GFR calc non Af Amer: 74 mL/min/{1.73_m2} (ref >59)
Globulin, Total: 2 g/dL (ref 1.5–4.5)
Glucose: 95 mg/dL (ref 65–99)
Potassium: 4.1 mmol/L (ref 3.5–5.2)
Sodium: 142 mmol/L (ref 134–144)
Total Protein: 6.3 g/dL (ref 6.0–8.5)

## 2019-04-23 LAB — LIPID PANEL
Chol/HDL Ratio: 2.4 ratio (ref 0.0–4.4)
Cholesterol, Total: 195 mg/dL (ref 100–199)
HDL: 81 mg/dL (ref >39)
LDL Chol Calc (NIH): 99 mg/dL (ref 0–99)
Triglycerides: 87 mg/dL (ref 0–149)
VLDL Cholesterol Cal: 15 mg/dL (ref 5–40)

## 2019-04-23 LAB — TSH: TSH: 1.19 u[IU]/mL (ref 0.450–4.500)

## 2019-04-23 LAB — VITAMIN D 25 HYDROXY (VIT D DEFICIENCY, FRACTURES): Vit D, 25-Hydroxy: 43.3 ng/mL (ref 30.0–100.0)

## 2019-05-05 DIAGNOSIS — H2513 Age-related nuclear cataract, bilateral: Secondary | ICD-10-CM | POA: Diagnosis not present

## 2019-06-07 DIAGNOSIS — R69 Illness, unspecified: Secondary | ICD-10-CM | POA: Diagnosis not present

## 2019-08-18 DIAGNOSIS — R69 Illness, unspecified: Secondary | ICD-10-CM | POA: Diagnosis not present

## 2019-11-24 DIAGNOSIS — Z6821 Body mass index (BMI) 21.0-21.9, adult: Secondary | ICD-10-CM | POA: Diagnosis not present

## 2019-11-24 DIAGNOSIS — Z1231 Encounter for screening mammogram for malignant neoplasm of breast: Secondary | ICD-10-CM | POA: Diagnosis not present

## 2019-11-24 DIAGNOSIS — Z01419 Encounter for gynecological examination (general) (routine) without abnormal findings: Secondary | ICD-10-CM | POA: Diagnosis not present

## 2020-04-05 ENCOUNTER — Other Ambulatory Visit: Payer: Self-pay

## 2020-04-05 ENCOUNTER — Ambulatory Visit (INDEPENDENT_AMBULATORY_CARE_PROVIDER_SITE_OTHER): Payer: Medicare HMO | Admitting: Family Medicine

## 2020-04-05 ENCOUNTER — Encounter: Payer: Self-pay | Admitting: Family Medicine

## 2020-04-05 VITALS — BP 142/60 | HR 100 | Temp 98.0°F | Wt 136.0 lb

## 2020-04-05 DIAGNOSIS — N390 Urinary tract infection, site not specified: Secondary | ICD-10-CM

## 2020-04-05 LAB — POCT URINALYSIS DIPSTICK
Bilirubin, UA: NEGATIVE
Blood, UA: NEGATIVE
Glucose, UA: NEGATIVE
Ketones, UA: NEGATIVE
Nitrite, UA: NEGATIVE
Protein, UA: NEGATIVE
Spec Grav, UA: 1.025 (ref 1.010–1.025)
Urobilinogen, UA: 0.2 E.U./dL
pH, UA: 5 (ref 5.0–8.0)

## 2020-04-05 MED ORDER — SULFAMETHOXAZOLE-TRIMETHOPRIM 800-160 MG PO TABS: 1.0000 | ORAL_TABLET | Freq: Two times a day (BID) | ORAL | 0 refills | Status: DC

## 2020-04-05 NOTE — Progress Notes (Signed)
Established patient visit   Patient: Robin Warren   DOB: Jan 15, 1951   69 y.o. Female  MRN: [id number] Visit Date: 04/05/2020  Today's healthcare provider: Vernie Murders, PA   Chief Complaint  Patient presents with  . Urinary Tract Infection   Subjective    HPI HPI    Urinary Tract Infection    This is a new problem.  Recent episode started in the past 7 days.  The problem has been waxing and waning since onset.  Severity of the pain is mild (Pressure not pain ).  The problem occurs every urination.  Abdominal Pain: Absent.  Back Pain: Absent.  Chills: Absent.  Cloudy malodorus urine: Absent.  Constipation: Absent.  Cramping: Absent.  Diarrhea: Absent.  Discharge: Absent.  Fever: Absent.  Hematuria: Absent.  Nausea: Absent.  Vomiting: Absent.       Last edited by Juluis Mire, CMA on 04/05/2020 11:30 AM. (History)       Urinary symptoms   She reports new onset urinary urgency. The current episode started a few days ago and is staying constant. Patient states symptoms are moderate in intensity, occurring intermittently. She  has not been recently treated for similar symptoms.     Medications: Outpatient Medications Prior to Visit  Medication Sig  . BIOTIN PO Take by mouth.  . Cholecalciferol (VITAMIN D PO) Take by mouth.  Marland Kitchen ibuprofen (ADVIL) 100 MG tablet Take 100 mg by mouth every 6 (six) hours as needed for fever.  . loratadine (CLARITIN) 10 MG tablet Take 10 mg by mouth daily.  . MULTIPLE VITAMIN PO Take 1 tablet by mouth daily.   No facility-administered medications prior to visit.  No past medical history on file. Past Surgical History:  Procedure Laterality Date  . NASOLACRIMAL DUCT PROBING W/ INSERTION OF STENT  2005   Family History  Problem Relation Age of Onset  . Stroke Maternal Grandmother   . Heart failure Maternal Grandfather   . Parkinson's disease Paternal Grandmother   . Heart attack Paternal Grandfather   . Heart failure Paternal  Grandfather   . Stroke Paternal Grandfather   . Healthy Sister    Social History   Tobacco Use  . Smoking status: Never Smoker  . Smokeless tobacco: Never Used  Substance Use Topics  . Alcohol use: Yes    Alcohol/week: 0.0 standard drinks    Comment: OCCASIONALLY  . Drug use: No   Review of Systems  Constitutional: Negative.   HENT: Negative.   Respiratory: Negative.   Cardiovascular: Negative.   Gastrointestinal: Negative.   Genitourinary: Positive for difficulty urinating.  Musculoskeletal: Negative.       Objective    BP (!) 142/60 (BP Location: Right Arm, Patient Position: Sitting, Cuff Size: Normal)   Pulse 100   Temp 98 F (36.7 C) (Oral)   Wt 136 lb (61.7 kg)   SpO2 98%   BMI 20.68 kg/m  BP Readings from Last 3 Encounters:  04/05/20 (!) 142/60  04/15/19 132/78  01/29/18 118/62    Physical Exam Constitutional:      General: She is not in acute distress.    Appearance: She is well-developed.  HENT:     Head: Normocephalic and atraumatic.     Right Ear: Hearing normal.     Left Ear: Hearing normal.     Nose: Nose normal.  Eyes:     General: Lids are normal. No scleral icterus.       Right eye: No  discharge.        Left eye: No discharge.     Conjunctiva/sclera: Conjunctivae normal.  Pulmonary:     Effort: Pulmonary effort is normal. No respiratory distress.  Abdominal:     General: Bowel sounds are normal.     Palpations: Abdomen is soft.     Tenderness: There is abdominal tenderness. There is no right CVA tenderness or left CVA tenderness.     Comments: Suprapubic tenderness.  Musculoskeletal:        General: Normal range of motion.  Skin:    Findings: No lesion or rash.  Neurological:     Mental Status: She is alert and oriented to person, place, and time.  Psychiatric:        Speech: Speech normal.        Behavior: Behavior normal.        Thought Content: Thought content normal.     No results found for any visits on 04/05/20.   Assessment & Plan     1. Urinary tract infection without hematuria, site unspecified Onset of urgency to urinate with oliguria over the past 5 days. No hematuria or fever. Unable to get enough specimen for C&S but UA showed some leukocytes on dipstick. Recommend AZO-Standard and Septra-DS. She will try to catch a specimen at home to send for culture. - POCT urinalysis dipstick - Urine Culture - sulfamethoxazole-trimethoprim (BACTRIM DS) 800-160 MG tablet; Take 1 tablet by mouth 2 (two) times daily.  Dispense: 14 tablet; Refill: 0   No follow-ups on file.      Andres Shad, PA, have reviewed all documentation for this visit. The documentation on 04/05/20 for the exam, diagnosis, procedures, and orders are all accurate and complete.    Vernie Murders, [place] [phone] (phone) [phone] (fax)  [place]

## 2020-04-06 DIAGNOSIS — N390 Urinary tract infection, site not specified: Secondary | ICD-10-CM | POA: Diagnosis not present

## 2020-04-07 ENCOUNTER — Ambulatory Visit: Payer: Medicare HMO | Admitting: Physician Assistant

## 2020-04-08 LAB — URINE CULTURE

## 2020-04-09 ENCOUNTER — Telehealth: Payer: Self-pay

## 2020-04-09 NOTE — Telephone Encounter (Signed)
-----   Message from Margo Common, [place] sent at 04/08/2020  8:08 AM EDT ----- A mix of bacteria on the culture. The sulfa drug should help set this straight. Continue increase in water intake and use AZO-Standard if needed for the urgency or discomfort. May have to recheck specimen if no better after finishing the antibiotic.

## 2020-04-09 NOTE — Telephone Encounter (Signed)
Patient advised of urine culture result and verbalized understanding.

## 2020-04-14 ENCOUNTER — Other Ambulatory Visit: Payer: Self-pay

## 2020-04-14 DIAGNOSIS — B349 Viral infection, unspecified: Secondary | ICD-10-CM | POA: Diagnosis not present

## 2020-04-16 LAB — SARS-COV-2, NAA 2 DAY TAT

## 2020-04-16 LAB — NOVEL CORONAVIRUS, NAA: SARS-CoV-2, NAA: NOT DETECTED

## 2020-04-19 ENCOUNTER — Telehealth: Payer: Self-pay | Admitting: Family Medicine

## 2020-04-19 NOTE — Telephone Encounter (Signed)
Copied from [place] [phone]. Topic: Medicare AWV >> Apr 19, 2020  2:06 PM Cher Nakai R wrote: Reason for CRM:   No answer unable to leave message for patient to call back and schedule Medicare Annual Wellness Visit (AWV) either virtually or in office before physical scheduled 05/10/2020.  Last AWV  04/15/2019  Please schedule at anytime with [facility] Health Advisor.  If any questions, please contact me at [phone]

## 2020-05-10 ENCOUNTER — Encounter: Payer: Self-pay | Admitting: Family Medicine

## 2020-05-10 ENCOUNTER — Ambulatory Visit (INDEPENDENT_AMBULATORY_CARE_PROVIDER_SITE_OTHER): Payer: Medicare HMO | Admitting: Family Medicine

## 2020-05-10 ENCOUNTER — Other Ambulatory Visit: Payer: Self-pay

## 2020-05-10 VITALS — BP 141/84 | HR 94 | Temp 98.9°F | Wt 131.0 lb

## 2020-05-10 DIAGNOSIS — L819 Disorder of pigmentation, unspecified: Secondary | ICD-10-CM | POA: Diagnosis not present

## 2020-05-10 DIAGNOSIS — Z0000 Encounter for general adult medical examination without abnormal findings: Secondary | ICD-10-CM | POA: Diagnosis not present

## 2020-05-10 DIAGNOSIS — M26629 Arthralgia of temporomandibular joint, unspecified side: Secondary | ICD-10-CM

## 2020-05-10 DIAGNOSIS — Z1211 Encounter for screening for malignant neoplasm of colon: Secondary | ICD-10-CM

## 2020-05-10 DIAGNOSIS — E559 Vitamin D deficiency, unspecified: Secondary | ICD-10-CM

## 2020-05-10 DIAGNOSIS — E7800 Pure hypercholesterolemia, unspecified: Secondary | ICD-10-CM | POA: Diagnosis not present

## 2020-05-10 DIAGNOSIS — M4134 Thoracogenic scoliosis, thoracic region: Secondary | ICD-10-CM | POA: Diagnosis not present

## 2020-05-10 NOTE — Progress Notes (Signed)
Complete physical exam   Patient: Robin Warren   DOB: 03/18/1951   69 y.o. Female  MRN: [id number] Visit Date: 05/10/2020  Today's healthcare provider: Vernie Murders, PA   No chief complaint on file.  Subjective    Robin Warren is a 69 y.o. female who presents today for a complete physical exam.  She reports consuming a general diet.  She generally feels well. She reports sleeping fairly well. She does not have additional problems to discuss today.    No past medical history on file.   Past Surgical History:  Procedure Laterality Date  . NASOLACRIMAL DUCT PROBING W/ INSERTION OF STENT  2005   Social History   Socioeconomic History  . Marital status: Single    Spouse name: Not on file  . Number of children: Not on file  . Years of education: Not on file  . Highest education level: Not on file  Occupational History  . Not on file  Tobacco Use  . Smoking status: Never Smoker  . Smokeless tobacco: Never Used  Substance and Sexual Activity  . Alcohol use: Yes    Alcohol/week: 0.0 standard drinks    Comment: OCCASIONALLY  . Drug use: No  . Sexual activity: Not on file  Other Topics Concern  . Not on file  Social History Narrative  . Not on file   Social Determinants of Health   Financial Resource Strain:   . Difficulty of Paying Living Expenses: Not on file  Food Insecurity:   . Worried About Charity fundraiser in the Last Year: Not on file  . Ran Out of Food in the Last Year: Not on file  Transportation Needs:   . Lack of Transportation (Medical): Not on file  . Lack of Transportation (Non-Medical): Not on file  Physical Activity:   . Days of Exercise per Week: Not on file  . Minutes of Exercise per Session: Not on file  Stress:   . Feeling of Stress : Not on file  Social Connections:   . Frequency of Communication with Friends and Family: Not on file  . Frequency of Social Gatherings with Friends and Family: Not on file  . Attends Religious  Services: Not on file  . Active Member of Clubs or Organizations: Not on file  . Attends Archivist Meetings: Not on file  . Marital Status: Not on file  Intimate Partner Violence:   . Fear of Current or Ex-Partner: Not on file  . Emotionally Abused: Not on file  . Physically Abused: Not on file  . Sexually Abused: Not on file   Family Status  Relation Name Status  . Mother  Deceased  . MGM  Deceased  . MGF  Deceased  . PGM  Deceased  . PGF  Deceased  . Sister  Alive  . Father  Deceased   Family History  Problem Relation Age of Onset  . Stroke Maternal Grandmother   . Heart failure Maternal Grandfather   . Parkinson's disease Paternal Grandmother   . Heart attack Paternal Grandfather   . Heart failure Paternal Grandfather   . Stroke Paternal Grandfather   . Healthy Sister    Allergies  Allergen Reactions  . Aspirin     Stomach bleed    Patient Care Team: Aubrii Sharpless, Vickki Muff, PA as PCP - General (Physician Assistant)   Medications: Outpatient Medications Prior to Visit  Medication Sig  . BIOTIN PO Take by mouth.  Marland Kitchen  Cholecalciferol (VITAMIN D PO) Take by mouth.  Marland Kitchen ibuprofen (ADVIL) 100 MG tablet Take 100 mg by mouth every 6 (six) hours as needed for fever.  . loratadine (CLARITIN) 10 MG tablet Take 10 mg by mouth daily.  . MULTIPLE VITAMIN PO Take 1 tablet by mouth daily.  Marland Kitchen sulfamethoxazole-trimethoprim (BACTRIM DS) 800-160 MG tablet Take 1 tablet by mouth 2 (two) times daily.   No facility-administered medications prior to visit.    Review of Systems  Constitutional: Negative.   HENT: Negative.   Eyes: Negative.   Respiratory: Negative.   Cardiovascular: Negative.   Gastrointestinal: Negative.   Endocrine: Negative.   Genitourinary: Negative.   Musculoskeletal: Negative.   Skin: Negative.   Allergic/Immunologic: Negative.   Neurological: Negative.   Hematological: Negative.   Psychiatric/Behavioral: Negative.        Objective    BP (!)  141/84 (BP Location: Right Arm, Patient Position: Sitting, Cuff Size: Normal)   Pulse 94   Temp 98.9 F (37.2 C) (Oral)   Wt 131 lb (59.4 kg)   SpO2 96%   BMI 19.92 kg/m   BP Readings from Last 3 Encounters:  05/10/20 (!) 141/84  04/05/20 (!) 142/60  04/15/19 132/78   Wt Readings from Last 3 Encounters:  05/10/20 131 lb (59.4 kg)  04/05/20 136 lb (61.7 kg)  04/15/19 141 lb (64 kg)   Physical Exam Constitutional:      Appearance: Normal appearance. She is normal weight.  HENT:     Head: Normocephalic and atraumatic.     Right Ear: Tympanic membrane, ear canal and external ear normal.     Left Ear: Tympanic membrane, ear canal and external ear normal.     Nose: Nose normal.     Mouth/Throat:     Mouth: Mucous membranes are moist.     Pharynx: Oropharynx is clear.  Eyes:     Extraocular Movements: Extraocular movements intact.     Conjunctiva/sclera: Conjunctivae normal.     Pupils: Pupils are equal, round, and reactive to light.  Cardiovascular:     Rate and Rhythm: Normal rate and regular rhythm.     Pulses: Normal pulses.     Heart sounds: Normal heart sounds.  Pulmonary:     Effort: Pulmonary effort is normal.     Breath sounds: Normal breath sounds.  Abdominal:     General: Abdomen is flat. Bowel sounds are normal.     Palpations: Abdomen is soft.  Musculoskeletal:        General: Normal range of motion.     Cervical back: Normal range of motion and neck supple.  Skin:    General: Skin is warm and dry.     Findings: Lesion present.     Comments: 5-6 mm tan lesion with two darker spots on the upper sternum area.   Neurological:     General: No focal deficit present.     Mental Status: She is alert and oriented to person, place, and time. Mental status is at baseline.  Psychiatric:        Mood and Affect: Mood normal.        Behavior: Behavior normal.        Thought Content: Thought content normal.        Judgment: Judgment normal.     Last depression  screening scores PHQ 2/9 Scores 04/15/2019 01/29/2018 12/04/2016  PHQ - 2 Score 0 0 0  PHQ- 9 Score - 0 0   Last fall risk screening  Fall Risk  04/15/2019  Falls in the past year? 0  Number falls in past yr: 0  Injury with Fall? 0   Last Audit-C alcohol use screening Alcohol Use Disorder Test (AUDIT) 04/15/2019  1. How often do you have a drink containing alcohol? 3  2. How many drinks containing alcohol do you have on a typical day when you are drinking? 0  3. How often do you have six or more drinks on one occasion? 0  AUDIT-C Score 3  4. How often during the last year have you found that you were not able to stop drinking once you had started? 0  5. How often during the last year have you failed to do what was normally expected from you because of drinking? 0  6. How often during the last year have you needed a first drink in the morning to get yourself going after a heavy drinking session? 0  7. How often during the last year have you had a feeling of guilt of remorse after drinking? 0  8. How often during the last year have you been unable to remember what happened the night before because you had been drinking? 0  9. Have you or someone else been injured as a result of your drinking? 0  10. Has a relative or friend or a doctor or another health worker been concerned about your drinking or suggested you cut down? 0  Alcohol Use Disorder Identification Test Final Score (AUDIT) 3  Alcohol Brief Interventions/Follow-up AUDIT Score <7 follow-up not indicated   A score of 3 or more in women, and 4 or more in men indicates increased risk for alcohol abuse, EXCEPT if all of the points are from question 1   No results found for any visits on 05/10/20.  Assessment & Plan    Routine Health Maintenance and Physical Exam  Exercise Activities and Dietary recommendations Goals   Encouraged to exercise or walk 30-40 minutes 3-4 times a week.     Immunization History  Administered Date(s)  Administered  . Influenza,inj,Quad PF,6+ Mos 07/08/2016  . Influenza-Unspecified 05/23/2015, 06/18/2017, 06/07/2019  . Pneumococcal Conjugate-13 01/29/2018  . Pneumococcal Polysaccharide-23 04/17/2019  . Tdap 10/23/2011  . Zoster 01/31/2013    Health Maintenance  Topic Date Due  . COVID-19 Vaccine (1) Never done  . COLONOSCOPY  Never done  . MAMMOGRAM  09/01/2019  . INFLUENZA VACCINE  02/22/2020  . TETANUS/TDAP  10/22/2021  . DEXA SCAN  Completed  . Hepatitis C Screening  Completed  . PNA vac Low Risk Adult  Completed    Discussed health benefits of physical activity, and encouraged her to engage in regular exercise appropriate for her age and condition.  1. Medicare annual wellness visit, subsequent Good general health. Has had both COVID vaccinations (2 nd wa on 04-26-20). Recommend getting flu shot at her pharmacy in early November. Given anticipatory counseling. Recheck annually.  2. Avitaminosis D Continues to take Vitamin D daily. Recheck labs. History of osteopenia on bone density testing in 2019. - CBC with Differential/Platelet - VITAMIN D 25 Hydroxy (Vit-D Deficiency, Fractures)  3. Thoracogenic scoliosis of thoracic region Unchanged scoliosis of thoracic spine. Some pain in both SI regions when sitting with either ankle on knees. Recommend stretching and regular exercise. May need recheck of x-rays if flaring more frequently.  4. TMJ syndrome Well controlled at the present. Intermittent flares with eating hard foods or tough meats. Continues to use a little Tylenol and moist heat applications  when it flares.  5. Pigmented skin lesion of uncertain nature Present for a long time. Feels the lesion on the upper sternum is more prominent and recommend dermatology evaluation. Also, has some redness to cheeks and nose - ?rosacea?. - Ambulatory referral to Dermatology  6. Elevated LDL cholesterol level Past history of elevated LDL that was upper limits of normal in 2020.  Continue low fat diet and exercise regularly. Recheck labs. - Comprehensive metabolic panel - Lipid panel - TSH  7. Screening for colon cancer Asymptomatic. - Ambulatory referral to Gastroenterology   No follow-ups on file.     Andres Shad, PA, have reviewed all documentation for this visit. The documentation on 05/10/20 for the exam, diagnosis, procedures, and orders are all accurate and complete.    Vernie Murders, [place] [phone] (phone) [phone] (fax)  [place]

## 2020-05-13 DIAGNOSIS — E559 Vitamin D deficiency, unspecified: Secondary | ICD-10-CM | POA: Diagnosis not present

## 2020-05-13 DIAGNOSIS — E7800 Pure hypercholesterolemia, unspecified: Secondary | ICD-10-CM | POA: Diagnosis not present

## 2020-05-14 LAB — CBC WITH DIFFERENTIAL/PLATELET
Basophils Absolute: 0.1 10*3/uL (ref 0.0–0.2)
Basos: 1 %
EOS (ABSOLUTE): 0.2 10*3/uL (ref 0.0–0.4)
Eos: 3 %
Hematocrit: 41.3 % (ref 34.0–46.6)
Hemoglobin: 13.9 g/dL (ref 11.1–15.9)
Immature Grans (Abs): 0 10*3/uL (ref 0.0–0.1)
Immature Granulocytes: 0 %
Lymphocytes Absolute: 1.9 10*3/uL (ref 0.7–3.1)
Lymphs: 32 %
MCH: 31.4 pg (ref 26.6–33.0)
MCHC: 33.7 g/dL (ref 31.5–35.7)
MCV: 93 fL (ref 79–97)
Monocytes Absolute: 0.5 10*3/uL (ref 0.1–0.9)
Monocytes: 8 %
Neutrophils Absolute: 3.2 10*3/uL (ref 1.4–7.0)
Neutrophils: 56 %
Platelets: 253 10*3/uL (ref 150–450)
RBC: 4.42 x10E6/uL (ref 3.77–5.28)
RDW: 12.7 % (ref 11.7–15.4)
WBC: 5.9 10*3/uL (ref 3.4–10.8)

## 2020-05-14 LAB — LIPID PANEL
Chol/HDL Ratio: 2.6 ratio (ref 0.0–4.4)
Cholesterol, Total: 204 mg/dL — ABNORMAL HIGH (ref 100–199)
HDL: 78 mg/dL (ref >39)
LDL Chol Calc (NIH): 108 mg/dL — ABNORMAL HIGH (ref 0–99)
Triglycerides: 101 mg/dL (ref 0–149)
VLDL Cholesterol Cal: 18 mg/dL (ref 5–40)

## 2020-05-14 LAB — COMPREHENSIVE METABOLIC PANEL
ALT: 18 IU/L (ref 0–32)
AST: 22 IU/L (ref 0–40)
Albumin/Globulin Ratio: 2.3 — ABNORMAL HIGH (ref 1.2–2.2)
Albumin: 4.6 g/dL (ref 3.8–4.8)
Alkaline Phosphatase: 70 IU/L (ref 44–121)
BUN/Creatinine Ratio: 15 (ref 12–28)
BUN: 11 mg/dL (ref 8–27)
Bilirubin Total: 0.6 mg/dL (ref 0.0–1.2)
CO2: 25 mmol/L (ref 20–29)
Calcium: 9.8 mg/dL (ref 8.7–10.3)
Chloride: 102 mmol/L (ref 96–106)
Creatinine, Ser: 0.72 mg/dL (ref 0.57–1.00)
GFR calc Af Amer: 100 mL/min/{1.73_m2} (ref >59)
GFR calc non Af Amer: 87 mL/min/{1.73_m2} (ref >59)
Globulin, Total: 2 g/dL (ref 1.5–4.5)
Glucose: 91 mg/dL (ref 65–99)
Potassium: 4.2 mmol/L (ref 3.5–5.2)
Sodium: 145 mmol/L — ABNORMAL HIGH (ref 134–144)
Total Protein: 6.6 g/dL (ref 6.0–8.5)

## 2020-05-14 LAB — TSH: TSH: 0.791 u[IU]/mL (ref 0.450–4.500)

## 2020-05-14 LAB — VITAMIN D 25 HYDROXY (VIT D DEFICIENCY, FRACTURES): Vit D, 25-Hydroxy: 46.8 ng/mL (ref 30.0–100.0)

## 2020-05-17 ENCOUNTER — Other Ambulatory Visit: Payer: Self-pay | Admitting: Family Medicine

## 2020-05-17 DIAGNOSIS — R509 Fever, unspecified: Secondary | ICD-10-CM | POA: Diagnosis not present

## 2020-05-17 DIAGNOSIS — R110 Nausea: Secondary | ICD-10-CM

## 2020-05-17 DIAGNOSIS — R5381 Other malaise: Secondary | ICD-10-CM | POA: Diagnosis not present

## 2020-05-17 DIAGNOSIS — R5383 Other fatigue: Secondary | ICD-10-CM | POA: Diagnosis not present

## 2020-05-17 NOTE — Progress Notes (Signed)
Having temperature 99.6 to 99.9 over the past 2-3 days. Some nausea, body aches, malaise and fatigue. Recommend COVID, Flu A&B and RSR test panel. May use Pepto-Bismol or Bonine prn nausea. Increase fluid intake and isolate at home until free of fever and test results available. Has had both COVID Moderna vaccinations (03-15-20 and 04-26-20).

## 2020-05-18 LAB — COVID-19, FLU A+B AND RSV
Influenza A, NAA: NOT DETECTED
Influenza B, NAA: NOT DETECTED
RSV, NAA: NOT DETECTED
SARS-CoV-2, NAA: NOT DETECTED

## 2020-05-19 ENCOUNTER — Encounter: Payer: Self-pay | Admitting: General Practice

## 2020-06-02 DIAGNOSIS — R69 Illness, unspecified: Secondary | ICD-10-CM | POA: Diagnosis not present

## 2020-06-07 ENCOUNTER — Telehealth (INDEPENDENT_AMBULATORY_CARE_PROVIDER_SITE_OTHER): Payer: Self-pay | Admitting: Gastroenterology

## 2020-06-07 ENCOUNTER — Other Ambulatory Visit: Payer: Self-pay

## 2020-06-07 DIAGNOSIS — Z1211 Encounter for screening for malignant neoplasm of colon: Secondary | ICD-10-CM

## 2020-06-07 MED ORDER — PEG 3350-KCL-NA BICARB-NACL 420 G PO SOLR: 4000.0000 mL | Freq: Once | ORAL | 0 refills | Status: AC

## 2020-06-07 NOTE — Progress Notes (Signed)
Gastroenterology Pre-Procedure Review  Request Date: Monday 08/02/20 Requesting Physician: Dr. Allen Norris  PATIENT REVIEW QUESTIONS: The patient responded to the following health history questions as indicated:    1. Are you having any GI issues? no 2. Do you have a personal history of Polyps? no 3. Do you have a family history of Colon Cancer or Polyps? no 4. Diabetes Mellitus? no 5. Joint replacements in the past 12 months?no 6. Major health problems in the past 3 months?no 7. Any artificial heart valves, MVP, or defibrillator?no    MEDICATIONS & ALLERGIES:    Patient reports the following regarding taking any anticoagulation/antiplatelet therapy:   Plavix, Coumadin, Eliquis, Xarelto, Lovenox, Pradaxa, Brilinta, or Effient? no Aspirin? no  Patient confirms/reports the following medications:  Current Outpatient Medications  Medication Sig Dispense Refill  . BIOTIN PO Take by mouth.    . Cholecalciferol (VITAMIN D PO) Take by mouth.    Marland Kitchen ibuprofen (ADVIL) 100 MG tablet Take 100 mg by mouth every 6 (six) hours as needed for fever.    . loratadine (CLARITIN) 10 MG tablet Take 10 mg by mouth daily.    . MULTIPLE VITAMIN PO Take 1 tablet by mouth daily.    . raNITIdine HCl (WAL-ZAN 75 PO) Zantac     No current facility-administered medications for this visit.    Patient confirms/reports the following allergies:  Allergies  Allergen Reactions  . Aspirin     Stomach bleed    No orders of the defined types were placed in this encounter.   AUTHORIZATION INFORMATION Primary Insurance: 1D#: Group #:  Secondary Insurance: 1D#: Group #:  SCHEDULE INFORMATION: Date: Monday 08/02/20 Time: Location:[facility]

## 2020-06-14 ENCOUNTER — Ambulatory Visit: Payer: Medicare HMO | Admitting: Dermatology

## 2020-06-23 DIAGNOSIS — H18591 Other hereditary corneal dystrophies, right eye: Secondary | ICD-10-CM | POA: Diagnosis not present

## 2020-06-23 DIAGNOSIS — H52203 Unspecified astigmatism, bilateral: Secondary | ICD-10-CM | POA: Diagnosis not present

## 2020-06-23 DIAGNOSIS — H1789 Other corneal scars and opacities: Secondary | ICD-10-CM | POA: Diagnosis not present

## 2020-06-30 ENCOUNTER — Encounter: Payer: Self-pay | Admitting: Dermatology

## 2020-06-30 ENCOUNTER — Ambulatory Visit: Payer: Medicare HMO | Admitting: Dermatology

## 2020-06-30 ENCOUNTER — Other Ambulatory Visit: Payer: Self-pay

## 2020-06-30 DIAGNOSIS — L820 Inflamed seborrheic keratosis: Secondary | ICD-10-CM

## 2020-06-30 DIAGNOSIS — D1801 Hemangioma of skin and subcutaneous tissue: Secondary | ICD-10-CM | POA: Diagnosis not present

## 2020-06-30 DIAGNOSIS — L821 Other seborrheic keratosis: Secondary | ICD-10-CM

## 2020-06-30 NOTE — Patient Instructions (Signed)

## 2020-06-30 NOTE — Progress Notes (Signed)
   Follow-Up Visit   Subjective  Robin Warren is a 69 y.o. female who presents for the following: lesion (Spot on chest. Has been there for years. Looks darker recently.  ).  Also has new scaly spot on face to check and red bump on pinky finger, present for many years.    The following portions of the chart were reviewed this encounter and updated as appropriate:     Review of Systems: No other skin or systemic complaints except as noted in HPI or Assessment and Plan.  Objective  Well appearing patient in no apparent distress; mood and affect are within normal limits.  A focused examination was performed including face, chest, right 5th finger. Relevant physical exam findings are noted in the Assessment and Plan.  Objective  Right Distal 5th Finger at proximal nail fold: 2.63mm red papule  Objective  Left Malar Cheek: Waxy flesh flat papule with mild scale   Objective  mid sternum: 17mm waxy brown speckled macule  Assessment & Plan  Hemangioma of skin Right Distal 5th Finger at proximal nail fold  Benign, observe.    Inflamed seborrheic keratosis Left Malar Cheek  Reassured benign age-related growth.  Recommend observation.  Discussed cryotherapy if spot(s) become irritated or inflamed.   Patient defers treatment today since not bothersome. She will RTC if changes noted.  Seborrheic keratosis mid sternum  Benign-appearing.  Observation.  Call clinic for new or changing lesions.  Recommend daily use of broad spectrum spf 30+ sunscreen to sun-exposed areas.    ABCDEs of mole observation discussed.  RTC if any changes noted.  Discussed photoprotection and regular use of broad-spectrum spf 30+ sunscreen.  Return if symptoms worsen or fail to improve.   I, Emelia Salisbury, CMA, am acting as scribe for Brendolyn Patty, MD.  Documentation: I have reviewed the above documentation for accuracy and completeness, and I agree with the above.  Brendolyn Patty MD

## 2020-07-30 ENCOUNTER — Encounter: Payer: Self-pay | Admitting: Gastroenterology

## 2020-08-05 ENCOUNTER — Other Ambulatory Visit: Payer: Self-pay

## 2020-08-05 ENCOUNTER — Telehealth: Payer: Self-pay

## 2020-08-05 ENCOUNTER — Other Ambulatory Visit
Admission: RE | Admit: 2020-08-05 | Discharge: 2020-08-05 | Disposition: A | Discharge status: Home / Self Care | Payer: Medicare HMO | Source: Ambulatory Visit | Attending: Gastroenterology | Admitting: Gastroenterology

## 2020-08-05 DIAGNOSIS — Z01812 Encounter for preprocedural laboratory examination: Secondary | ICD-10-CM | POA: Diagnosis not present

## 2020-08-05 DIAGNOSIS — Z20822 Contact with and (suspected) exposure to covid-19: Secondary | ICD-10-CM | POA: Diagnosis not present

## 2020-08-05 LAB — SARS CORONAVIRUS 2 (TAT 6-24 HRS): SARS Coronavirus 2: NEGATIVE
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
## 2020-08-05 NOTE — Discharge Instructions (Signed)

## 2020-08-11 ENCOUNTER — Encounter: Payer: Self-pay | Admitting: Gastroenterology

## 2020-08-12 ENCOUNTER — Encounter: Payer: Self-pay | Admitting: Anesthesiology

## 2020-08-12 ENCOUNTER — Encounter: Payer: Self-pay | Admitting: Gastroenterology

## 2020-08-12 ENCOUNTER — Ambulatory Visit
Admission: RE | Admit: 2020-08-12 | Discharge: 2020-08-12 | Disposition: A | Discharge status: Home / Self Care | Payer: Medicare HMO | Attending: Gastroenterology | Admitting: Gastroenterology

## 2020-08-12 ENCOUNTER — Other Ambulatory Visit: Payer: Self-pay

## 2020-08-12 ENCOUNTER — Encounter
Admission: RE | Disposition: A | Discharge status: Home / Self Care | Payer: Self-pay | Source: Home / Self Care | Attending: Gastroenterology

## 2020-08-12 ENCOUNTER — Ambulatory Visit: Payer: Self-pay | Admitting: Anesthesiology

## 2020-08-12 DIAGNOSIS — K5730 Diverticulosis of large intestine without perforation or abscess without bleeding: Secondary | ICD-10-CM | POA: Diagnosis not present

## 2020-08-12 DIAGNOSIS — Z1211 Encounter for screening for malignant neoplasm of colon: Secondary | ICD-10-CM | POA: Diagnosis not present

## 2020-08-12 DIAGNOSIS — K621 Rectal polyp: Secondary | ICD-10-CM | POA: Diagnosis not present

## 2020-08-12 DIAGNOSIS — D123 Benign neoplasm of transverse colon: Secondary | ICD-10-CM | POA: Diagnosis not present

## 2020-08-12 DIAGNOSIS — D128 Benign neoplasm of rectum: Secondary | ICD-10-CM | POA: Insufficient documentation

## 2020-08-12 DIAGNOSIS — K5790 Diverticulosis of intestine, part unspecified, without perforation or abscess without bleeding: Secondary | ICD-10-CM | POA: Diagnosis not present

## 2020-08-12 DIAGNOSIS — K635 Polyp of colon: Secondary | ICD-10-CM | POA: Diagnosis not present

## 2020-08-12 HISTORY — DX: Family history of other specified conditions: Z84.89

## 2020-08-12 HISTORY — PX: COLONOSCOPY WITH PROPOFOL: SHX5780

## 2020-08-12 HISTORY — DX: Motion sickness, initial encounter: T75.3XXA

## 2020-08-12 HISTORY — DX: Gastro-esophageal reflux disease without esophagitis: K21.9

## 2020-08-12 HISTORY — DX: Dislocation of jaw, unspecified side, initial encounter: S03.00XA

## 2020-08-12 SURGERY — COLONOSCOPY WITH PROPOFOL
Anesthesia: General

## 2020-08-12 MED ORDER — PROPOFOL 500 MG/50ML IV EMUL
INTRAVENOUS | Status: DC | PRN
  Administered 2020-08-12: 100 ug/kg/min via INTRAVENOUS

## 2020-08-12 MED ORDER — PROPOFOL 10 MG/ML IV BOLUS
INTRAVENOUS | Status: DC | PRN
  Administered 2020-08-12: 60 mg via INTRAVENOUS
  Administered 2020-08-12 (×2): 20 mg via INTRAVENOUS

## 2020-08-12 MED ORDER — PROPOFOL 500 MG/50ML IV EMUL
INTRAVENOUS | Status: AC
  Filled 2020-08-12: qty 50

## 2020-08-12 MED ORDER — LIDOCAINE HCL (CARDIAC) PF 100 MG/5ML IV SOSY
PREFILLED_SYRINGE | INTRAVENOUS | Status: DC | PRN
  Administered 2020-08-12: 100 mg via INTRAVENOUS

## 2020-08-12 MED ORDER — SODIUM CHLORIDE 0.9 % IV SOLN: INTRAVENOUS | Status: DC

## 2020-08-12 NOTE — Anesthesia Preprocedure Evaluation (Signed)
Anesthesia Evaluation  Patient identified by MRN, date of birth, ID band Patient awake    Reviewed: Allergy & Precautions, H&P , NPO status , Patient's Chart, lab work & pertinent test results  History of Anesthesia Complications (+) Family history of anesthesia reaction  Airway Mallampati: II  TM Distance: >3 FB Neck ROM: Full    Dental no notable dental hx.    Pulmonary neg pulmonary ROS,    Pulmonary exam normal        Cardiovascular negative cardio ROS Normal cardiovascular exam     Neuro/Psych negative neurological ROS  negative psych ROS   GI/Hepatic Neg liver ROS, GERD  Controlled,  Endo/Other  negative endocrine ROS  Renal/GU negative Renal ROS  negative genitourinary   Musculoskeletal negative musculoskeletal ROS (+)   Abdominal   Peds negative pediatric ROS (+)  Hematology negative hematology ROS (+)   Anesthesia Other Findings Screening Colonoscopy  Reproductive/Obstetrics negative OB ROS                             Anesthesia Physical Anesthesia Plan  ASA: I  Anesthesia Plan: General   Post-op Pain Management:    Induction: Intravenous  PONV Risk Score and Plan: 2  Airway Management Planned: Nasal Cannula  Additional Equipment:   Intra-op Plan:   Post-operative Plan:   Informed Consent: I have reviewed the patients History and Physical, chart, labs and discussed the procedure including the risks, benefits and alternatives for the proposed anesthesia with the patient or authorized representative who has indicated his/her understanding and acceptance.       Plan Discussed with: CRNA, Anesthesiologist and Surgeon  Anesthesia Plan Comments:         Anesthesia Quick Evaluation

## 2020-08-12 NOTE — Transfer of Care (Signed)
Immediate Anesthesia Transfer of Care Note  Patient: Robin Warren  Procedure(s) Performed: COLONOSCOPY WITH PROPOFOL (N/A )  Patient Location: PACU and Endoscopy Unit  Anesthesia Type:General  Level of Consciousness: awake, alert , oriented and patient cooperative  Airway & Oxygen Therapy: Patient Spontanous Breathing  Post-op Assessment: Report given to RN and Post -op Vital signs reviewed and stable  Post vital signs: Reviewed and stable  Last Vitals:  Vitals Value Taken Time  BP 112/77 08/12/20 1013  Temp 36.4 C 08/12/20 1010  Pulse 92 08/12/20 1013  Resp 22 08/12/20 1013  SpO2 100 % 08/12/20 1013  Vitals shown include unvalidated device data.  Last Pain:  Vitals:   08/12/20 1010  TempSrc: Temporal  PainSc:          Complications: No complications documented.

## 2020-08-12 NOTE — Anesthesia Postprocedure Evaluation (Signed)
Anesthesia Post Note  Patient: Robin Warren  Procedure(s) Performed: COLONOSCOPY WITH PROPOFOL (N/A )  Patient location during evaluation: PACU Anesthesia Type: General Level of consciousness: awake and alert, awake and oriented Pain management: pain level controlled Vital Signs Assessment: post-procedure vital signs reviewed and stable Respiratory status: spontaneous breathing, nonlabored ventilation and respiratory function stable Cardiovascular status: blood pressure returned to baseline and stable Postop Assessment: no apparent nausea or vomiting Anesthetic complications: no   No complications documented.   Last Vitals:  Vitals:   08/12/20 1030 08/12/20 1040  BP: (!) 141/73 (!) 143/81  Pulse: 82 82  Resp: 20 20  Temp:    SpO2: 100% 100%    Last Pain:  Vitals:   08/12/20 1010  TempSrc: Temporal  PainSc:                  Phill Mutter

## 2020-08-12 NOTE — H&P (Signed)
Lucilla Lame, MD [address], [place] [address] Phone: [phone] Fax : [phone]  Primary Care Physician:  Margo Common, PA-C Primary Gastroenterologist:  Dr. Allen Norris  Pre-Procedure History & Physical: HPI:  Robin Warren is a 70 y.o. female is here for a screening colonoscopy.   Past Medical History:  Diagnosis Date  . Family history of adverse reaction to anesthesia    dad N/V  . GERD (gastroesophageal reflux disease)   . Motion sickness   . TMJ (dislocation of temporomandibular joint)     Past Surgical History:  Procedure Laterality Date  . NASOLACRIMAL DUCT PROBING W/ INSERTION OF STENT  2005    Prior to Admission medications   Medication Sig Start Date End Date Taking? Authorizing Provider  Ascorbic Acid (VITAMIN C) 1000 MG tablet Take 1,000 mg by mouth daily.   Yes [provider]  calcium carbonate (TUMS - DOSED IN MG ELEMENTAL CALCIUM) 500 MG chewable tablet Chew 2 tablets by mouth daily.   Yes [provider]  Cholecalciferol (VITAMIN D PO) Take by mouth.   Yes [provider]  loratadine (CLARITIN) 10 MG tablet Take 10 mg by mouth daily.   Yes [provider]  MULTIPLE VITAMIN PO Take 1 tablet by mouth daily.   Yes [provider]  Multiple Vitamins-Minerals (AIRBORNE PO) Take by mouth daily.   Yes [provider]  BIOTIN PO Take by mouth. Patient not taking: Reported on 07/30/2020    [provider]  ibuprofen (ADVIL) 100 MG tablet Take 100 mg by mouth every 6 (six) hours as needed for fever.    [provider]  raNITIdine HCl (WAL-ZAN 75 PO) Zantac Patient not taking: Reported on 07/30/2020    [provider]    Allergies as of 06/07/2020 - Review Complete 06/07/2020  Allergen Reaction Noted  . Aspirin  04/12/2015    Family History  Problem Relation Age of Onset  . Stroke Maternal Grandmother   . Heart failure Maternal Grandfather   .  Parkinson's disease Paternal Grandmother   . Heart attack Paternal Grandfather   . Heart failure Paternal Grandfather   . Stroke Paternal Grandfather   . Healthy Sister     Social History   Socioeconomic History  . Marital status: Single    Spouse name: Not on file  . Number of children: Not on file  . Years of education: Not on file  . Highest education level: Not on file  Occupational History  . Not on file  Tobacco Use  . Smoking status: Never Smoker  . Smokeless tobacco: Never Used  Substance and Sexual Activity  . Alcohol use: Yes    Alcohol/week: 1.0 standard drink    Types: 1 Standard drinks or equivalent per week    Comment: OCCASIONALLY  . Drug use: No  . Sexual activity: Not on file  Other Topics Concern  . Not on file  Social History Narrative  . Not on file   Social Determinants of Health   Financial Resource Strain: Not on file  Food Insecurity: Not on file  Transportation Needs: Not on file  Physical Activity: Not on file  Stress: Not on file  Social Connections: Not on file  Intimate Partner Violence: Not on file    Review of Systems: See HPI, otherwise negative ROS  Physical Exam: BP (!) 148/86   Pulse (!) 106   Temp 97.6 F (36.4 C) (Temporal)   Resp 16  Ht 5\' 7"  (1.702 m)   Wt 58.1 kg   SpO2 100%   BMI 20.06 kg/m  General:   Alert,  pleasant and cooperative in NAD Head:  Normocephalic and atraumatic. Neck:  Supple; no masses or thyromegaly. Lungs:  Clear throughout to auscultation.    Heart:  Regular rate and rhythm. Abdomen:  Soft, nontender and nondistended. Normal bowel sounds, without guarding, and without rebound.   Neurologic:  Alert and  oriented x4;  grossly normal neurologically.  Impression/Plan: Robin Warren is now here to undergo a screening colonoscopy.  Risks, benefits, and alternatives regarding colonoscopy have been reviewed with the patient.  Questions have been answered.  All parties agreeable.

## 2020-08-12 NOTE — Op Note (Signed)
[facility] Gastroenterology Patient Name: Robin Warren Procedure Date: 08/12/2020 9:44 AM MRN: [id number] Account #: [id number] Date of Birth: 1950-11-09 Admit Type: Outpatient Age: 70 Room: [facility] ENDO ROOM 4 Gender: Female Note Status: Finalized Procedure:             Colonoscopy Indications:           Screening for colorectal malignant neoplasm Providers:             Lucilla Lame MD, MD Referring MD:          Vickki Muff. Chrismon, MD (Referring MD) Medicines:             Propofol per Anesthesia Complications:         No immediate complications. Procedure:             Pre-Anesthesia Assessment:                        - Prior to the procedure, a History and Physical was                         performed, and patient medications and allergies were                         reviewed. The patient's tolerance of previous                         anesthesia was also reviewed. The risks and benefits                         of the procedure and the sedation options and risks                         were discussed with the patient. All questions were                         answered, and informed consent was obtained. Prior                         Anticoagulants: The patient has taken no previous                         anticoagulant or antiplatelet agents. ASA Grade                         Assessment: II - A patient with mild systemic disease.                         After reviewing the risks and benefits, the patient                         was deemed in satisfactory condition to undergo the                         procedure.                        After obtaining informed consent, the colonoscope was  passed under direct vision. Throughout the procedure,                         the patient's blood pressure, pulse, and oxygen                         saturations were monitored continuously. The                         Colonoscope was introduced through the  anus and                         advanced to the the cecum, identified by appendiceal                         orifice and ileocecal valve. The colonoscopy was                         performed without difficulty. The patient tolerated                         the procedure well. The quality of the bowel                         preparation was good. Findings:      The perianal and digital rectal examinations were normal.      A few small-mouthed diverticula were found in the sigmoid colon.      A 3 mm polyp was found in the rectum. The polyp was sessile. The polyp       was removed with a cold snare. Resection and retrieval were complete.      A 4 mm polyp was found in the transverse colon. The polyp was sessile.       The polyp was removed with a cold snare. Resection and retrieval were       complete. Impression:            - Diverticulosis in the sigmoid colon.                        - One 3 mm polyp in the rectum, removed with a cold                         snare. Resected and retrieved.                        - One 4 mm polyp in the transverse colon, removed with                         a cold snare. Resected and retrieved. Recommendation:        - Discharge patient to home.                        - Resume previous diet.                        - Continue present medications.                        - Await pathology results.                        -  Repeat colonoscopy in 7 years if polyp adenoma and                         10 years if hyperplastic Procedure Code(s):     --- Professional ---                        [phone], Colonoscopy, flexible; with removal of                         tumor(s), polyp(s), or other lesion(s) by snare                         technique Diagnosis Code(s):     --- Professional ---                        Z12.11, Encounter for screening for malignant neoplasm                         of colon                        K62.1, Rectal polyp                         K63.5, Polyp of colon CPT copyright 2019 American [employer] Association. All rights reserved. The codes documented in this report are preliminary and upon coder review may  be revised to meet current compliance requirements. Lucilla Lame MD, MD 08/12/2020 10:07:48 AM This report has been signed electronically. Number of Addenda: 0 Note Initiated On: 08/12/2020 9:44 AM Scope Withdrawal Time: 0 hours 7 minutes 53 seconds  Total Procedure Duration: 0 hours 16 minutes 58 seconds  Estimated Blood Loss:  Estimated blood loss: none.      [facility]

## 2020-08-13 ENCOUNTER — Encounter: Payer: Self-pay | Admitting: Gastroenterology

## 2020-08-13 LAB — SURGICAL PATHOLOGY

## 2020-08-14 ENCOUNTER — Encounter: Payer: Self-pay | Admitting: Gastroenterology

## 2020-09-20 ENCOUNTER — Other Ambulatory Visit: Payer: Self-pay

## 2020-09-20 ENCOUNTER — Ambulatory Visit: Payer: Medicare HMO | Admitting: Dermatology

## 2020-09-20 DIAGNOSIS — L905 Scar conditions and fibrosis of skin: Secondary | ICD-10-CM | POA: Diagnosis not present

## 2020-09-20 NOTE — Progress Notes (Signed)
   Follow-Up Visit   Subjective  Robin Warren is a 71 y.o. female who presents for the following: check spot (R lower leg, 4-6 weeks, no symptoms, pt was walking in yard felt pain and didn't see anything, the next day she noticed scab).  Scab has since come off and has left a pink spot.   The following portions of the chart were reviewed this encounter and updated as appropriate:       Review of Systems:  No other skin or systemic complaints except as noted in HPI or Assessment and Plan.  Objective  Well appearing patient in no apparent distress; mood and affect are within normal limits.  A focused examination was performed including R lower leg. Relevant physical exam findings are noted in the Assessment and Plan.  Objective  Right lower pretibia: Pink smooth macule 7.86mm  Images       Assessment & Plan  Scar Right lower pretibia  2ndary to recent trauma.  Reassurance, will fade with time Benign appearing, observe Recommend daily broad spectrum sunscreen SPF 30+ if sun-exposed, reapply every 2 hours as needed. Call if area is changing (thickening, scaling, bleeding).   Return if symptoms worsen or fail to improve.   I, Othelia Pulling, RMA, am acting as scribe for Brendolyn Patty, MD . Documentation: I have reviewed the above documentation for accuracy and completeness, and I agree with the above.  Brendolyn Patty MD

## 2020-09-27 ENCOUNTER — Ambulatory Visit: Payer: BLUE CROSS/BLUE SHIELD | Admitting: Dermatology

## 2020-09-30 ENCOUNTER — Telehealth: Payer: Self-pay | Admitting: Family Medicine

## 2020-09-30 DIAGNOSIS — J40 Bronchitis, not specified as acute or chronic: Secondary | ICD-10-CM

## 2020-09-30 MED ORDER — GUAIFENESIN-DM 100-10 MG/5ML PO SYRP: 5.0000 mL | ORAL_SOLUTION | ORAL | 0 refills | Status: AC | PRN

## 2020-09-30 MED ORDER — AMOXICILLIN 875 MG PO TABS: 875.0000 mg | ORAL_TABLET | Freq: Two times a day (BID) | ORAL | 0 refills | Status: DC

## 2020-09-30 NOTE — Telephone Encounter (Signed)
Has been treating rhinorrhea from allergic rhinitis since 04-27-21. Has progressed to cough with ache in chest and headache. No fever, sore throat or loss of taste. Will continue Claritin and Flonase and add Amoxicillin with Mucinex-DM or Robitussin-DM. If fever develops or symptoms persist, may need to get COVID test.

## 2020-10-19 DIAGNOSIS — H18453 Nodular corneal degeneration, bilateral: Secondary | ICD-10-CM | POA: Diagnosis not present

## 2020-10-19 DIAGNOSIS — H2513 Age-related nuclear cataract, bilateral: Secondary | ICD-10-CM | POA: Diagnosis not present

## 2020-11-30 DIAGNOSIS — Z6820 Body mass index (BMI) 20.0-20.9, adult: Secondary | ICD-10-CM | POA: Diagnosis not present

## 2020-11-30 DIAGNOSIS — R350 Frequency of micturition: Secondary | ICD-10-CM | POA: Diagnosis not present

## 2020-11-30 DIAGNOSIS — N958 Other specified menopausal and perimenopausal disorders: Secondary | ICD-10-CM | POA: Diagnosis not present

## 2020-11-30 DIAGNOSIS — Z1231 Encounter for screening mammogram for malignant neoplasm of breast: Secondary | ICD-10-CM | POA: Diagnosis not present

## 2020-11-30 DIAGNOSIS — Z01419 Encounter for gynecological examination (general) (routine) without abnormal findings: Secondary | ICD-10-CM | POA: Diagnosis not present

## 2020-12-27 DIAGNOSIS — H2513 Age-related nuclear cataract, bilateral: Secondary | ICD-10-CM | POA: Diagnosis not present

## 2020-12-27 DIAGNOSIS — H18453 Nodular corneal degeneration, bilateral: Secondary | ICD-10-CM | POA: Diagnosis not present

## 2021-01-10 ENCOUNTER — Other Ambulatory Visit: Payer: Self-pay | Admitting: Family Medicine

## 2021-01-10 DIAGNOSIS — N819 Female genital prolapse, unspecified: Secondary | ICD-10-CM | POA: Diagnosis not present

## 2021-01-10 DIAGNOSIS — J040 Acute laryngitis: Secondary | ICD-10-CM

## 2021-01-10 DIAGNOSIS — J40 Bronchitis, not specified as acute or chronic: Secondary | ICD-10-CM

## 2021-01-10 HISTORY — DX: Acute laryngitis: J04.0

## 2021-01-10 MED ORDER — AMOXICILLIN 875 MG PO TABS: 875.0000 mg | ORAL_TABLET | Freq: Two times a day (BID) | ORAL | 0 refills | Status: AC

## 2021-01-10 NOTE — Progress Notes (Signed)
Developed laryngitis over the past weekend. No fever but some PND. No cough or loss of taste and has had all COVID vaccinations. Requests refill of Amoxicillin 875 mg BID #20 because she is schedule to have a hysterectomy on 01-20-21 and is concerned the procedure may be postponed if any infection is present. Monitor temperature and notify her surgeon if no better in 4-5 days.

## 2021-01-13 ENCOUNTER — Encounter (HOSPITAL_BASED_OUTPATIENT_CLINIC_OR_DEPARTMENT_OTHER): Payer: Self-pay | Admitting: Obstetrics & Gynecology

## 2021-01-13 ENCOUNTER — Other Ambulatory Visit: Payer: Self-pay

## 2021-01-13 DIAGNOSIS — S0300XA Dislocation of jaw, unspecified side, initial encounter: Secondary | ICD-10-CM

## 2021-01-13 DIAGNOSIS — H269 Unspecified cataract: Secondary | ICD-10-CM

## 2021-01-13 DIAGNOSIS — T753XXA Motion sickness, initial encounter: Secondary | ICD-10-CM

## 2021-01-13 DIAGNOSIS — Z973 Presence of spectacles and contact lenses: Secondary | ICD-10-CM

## 2021-01-13 HISTORY — DX: Unspecified cataract: H26.9

## 2021-01-13 HISTORY — DX: Dislocation of jaw, unspecified side, initial encounter: S03.00XA

## 2021-01-13 HISTORY — DX: Presence of spectacles and contact lenses: Z97.3

## 2021-01-13 HISTORY — DX: Motion sickness, initial encounter: T75.3XXA

## 2021-01-13 NOTE — Progress Notes (Signed)
YOU ARE SCHEDULED FOR A COVID TEST ON   01-17-2021 at 1040 am.  THIS TEST MUST BE DONE BEFORE SURGERY. GO TO  [place], [place], IT IS APPROXIMATELY 2 MINUTES PAST [employer] ON THE RIGHT AND REMAIN IN YOUR CAR, THIS IS A DRIVE UP TEST.       Your procedure is scheduled on  01-20-2021  Report to [facility].   Call this number if you have problems the morning of surgery  :[phone].   OUR ADDRESS IS [address].  WE ARE LOCATED IN [facility].  PLEASE BRING YOUR INSURANCE CARD AND PHOTO ID DAY OF SURGERY.  ONLY ONE PERSON ALLOWED IN FACILITY WAITING AREA.                                     REMEMBER:  DO NOT EAT FOOD, CANDY GUM OR MINTS  AFTER MIDNIGHT . YOU MAY HAVE CLEAR LIQUIDS FROM MIDNIGHT UNTIL 430   AM. NO CLEAR LIQUIDS AFTER 430 AM MORNING OF SURGERY.  YOU MAY  BRUSH YOUR TEETH MORNING OF SURGERY AND RINSE YOUR MOUTH OUT, NO CHEWING GUM CANDY OR MINTS.    CLEAR LIQUID DIET   Foods Allowed                                                                     Foods Excluded  Coffee and tea, regular and decaf                             liquids that you cannot  Plain Jell-O any favor except red or purple                                           see through such as: Fruit ices (not with fruit pulp)                                     milk, soups, orange juice  Iced Popsicles                                    All solid food Carbonated beverages, regular and diet                                    Cranberry, grape and apple juices Sports drinks like Gatorade Lightly seasoned clear broth or consume(fat free) Sugar, honey syrup  Sample Menu Breakfast                                Lunch  Supper Cranberry juice                    Beef broth                            Chicken broth Jell-O                                     Grape juice                            Apple juice Coffee or tea                        Jell-O                                      Popsicle                                                Coffee or tea                        Coffee or tea  _____________________________________________________________________     TAKE THESE MEDICATIONS MORNING OF SURGERY WITH A SIP OF WATER: CLARITIN.  ONE VISITOR IS ALLOWED IN WAITING ROOM ONLY DAY OF SURGERY.  NO VISITOR MAY SPEND THE NIGHT.  VISITOR ARE ALLOWED TO STAY UNTIL 800 PM.                                    DO NOT WEAR JEWERLY, MAKE UP. DO NOT WEAR LOTIONS, POWDERS, PERFUMES OR NAIL POLISH. DO NOT SHAVE FOR 24 HOURS PRIOR TO DAY OF SURGERY. MEN MAY SHAVE FACE AND NECK. CONTACTS, GLASSES, OR DENTURES MAY NOT BE WORN TO SURGERY.                                    [facility] IS NOT RESPONSIBLE  FOR ANY BELONGINGS.                                                                    Marland Kitchen           [facility] - Preparing for Surgery Before surgery, you can play an important role.  Because skin is not sterile, your skin needs to be as free of germs as possible.  You can reduce the number of germs on your skin by washing with CHG (chlorahexidine gluconate) soap before surgery.  CHG is an antiseptic cleaner which kills germs and bonds with the skin to continue killing germs even after washing. Please DO NOT use if you have an allergy to CHG or antibacterial soaps.  If your skin becomes reddened/irritated stop  using the CHG and inform your nurse when you arrive at Short Stay. Do not shave (including legs and underarms) for at least 48 hours prior to the first CHG shower.  You may shave your face/neck. Please follow these instructions carefully:  1.  Shower with CHG Soap the night before surgery and the  morning of Surgery.  2.  If you choose to wash your hair, wash your hair first as usual with your  normal  shampoo.  3.  After you shampoo, rinse your hair and body thoroughly to remove the   shampoo.                            4.  Use CHG as you would any other liquid soap.  You can apply chg directly  to the skin and wash                      Gently with a scrungie or clean washcloth.  5.  Apply the CHG Soap to your body ONLY FROM THE NECK DOWN.   Do not use on face/ open                           Wound or open sores. Avoid contact with eyes, ears mouth and genitals (private parts).                       Wash face,  Genitals (private parts) with your normal soap.             6.  Wash thoroughly, paying special attention to the area where your surgery  will be performed.  7.  Thoroughly rinse your body with warm water from the neck down.  8.  DO NOT shower/wash with your normal soap after using and rinsing off  the CHG Soap.                9.  Pat yourself dry with a clean towel.            10.  Wear clean pajamas.            11.  Place clean sheets on your bed the night of your first shower and do not  sleep with pets. Day of Surgery : Do not apply any lotions/deodorants the morning of surgery.  Please wear clean clothes to the hospital/surgery center.  FAILURE TO FOLLOW THESE INSTRUCTIONS MAY RESULT IN THE CANCELLATION OF YOUR SURGERY PATIENT SIGNATURE_________________________________  NURSE SIGNATURE__________________________________  ________________________________________________________________________                                                        QUESTIONS Robin Warren PRE OP NURSE PHONE [phone].

## 2021-01-13 NOTE — Progress Notes (Signed)
Spoke w/ via phone for pre-op interview---pt Lab needs dos---- none              Lab results-----has lab appt 01-17-2021 930 COVID test -----01-17-2021 1045 (overnight stay) Arrive at -------530 am 01-20-2021 NPO after MN NO Solid Food.  Clear liquids from MN until---430 am then npo Med rec completed Medications to take morning of surgery -----claritin Patient instructed no nail polish to be worn day of surgery Patient instructed to bring photo id and insurance card day of surgery Patient aware to have Driver (ride ) / caregiver   sister denise stegall  for 24 hours after surgery  Patient Special Instructions -----none Pre-Op special Istructions -----none Patient verbalized understanding of instructions that were given at this phone interview. Patient denies shortness of breath, chest pain, fever, cough at this phone interview.

## 2021-01-17 ENCOUNTER — Other Ambulatory Visit (HOSPITAL_COMMUNITY)
Admission: RE | Admit: 2021-01-17 | Discharge: 2021-01-17 | Disposition: A | Discharge status: Home / Self Care | Payer: Medicare HMO | Source: Ambulatory Visit | Attending: Obstetrics & Gynecology | Admitting: Obstetrics & Gynecology

## 2021-01-17 ENCOUNTER — Encounter (HOSPITAL_COMMUNITY)
Admission: RE | Admit: 2021-01-17 | Discharge: 2021-01-17 | Disposition: A | Discharge status: Home / Self Care | Payer: Medicare HMO | Source: Ambulatory Visit | Attending: Obstetrics & Gynecology | Admitting: Obstetrics & Gynecology

## 2021-01-17 ENCOUNTER — Other Ambulatory Visit: Payer: Self-pay

## 2021-01-17 DIAGNOSIS — Z01812 Encounter for preprocedural laboratory examination: Secondary | ICD-10-CM | POA: Diagnosis not present

## 2021-01-17 DIAGNOSIS — Z20822 Contact with and (suspected) exposure to covid-19: Secondary | ICD-10-CM | POA: Diagnosis not present

## 2021-01-17 LAB — COMPREHENSIVE METABOLIC PANEL
ALT: 21 U/L (ref 0–44)
AST: 25 U/L (ref 15–41)
Albumin: 4.7 g/dL (ref 3.5–5.0)
Alkaline Phosphatase: 61 U/L (ref 38–126)
Anion gap: 9 (ref 5–15)
BUN: 13 mg/dL (ref 8–23)
CO2: 29 mmol/L (ref 22–32)
Calcium: 9.5 mg/dL (ref 8.9–10.3)
Chloride: 105 mmol/L (ref 98–111)
Creatinine, Ser: 0.66 mg/dL (ref 0.44–1.00)
GFR, Estimated: >60 mL/min (ref >60)
Glucose, Bld: 91 mg/dL (ref 70–99)
Potassium: 4.2 mmol/L (ref 3.5–5.1)
Sodium: 143 mmol/L (ref 135–145)
Total Bilirubin: 0.8 mg/dL (ref 0.3–1.2)
Total Protein: 7.6 g/dL (ref 6.5–8.1)

## 2021-01-17 LAB — CBC
HCT: 42.6 % (ref 36.0–46.0)
Hemoglobin: 13.7 g/dL (ref 12.0–15.0)
MCH: 30.9 pg (ref 26.0–34.0)
MCHC: 32.2 g/dL (ref 30.0–36.0)
MCV: 95.9 fL (ref 80.0–100.0)
Platelets: 274 10*3/uL (ref 150–400)
RBC: 4.44 MIL/uL (ref 3.87–5.11)
RDW: 12.7 % (ref 11.5–15.5)
WBC: 7.2 10*3/uL (ref 4.0–10.5)
nRBC: 0 % (ref 0.0–0.2)

## 2021-01-17 LAB — SARS CORONAVIRUS 2 (TAT 6-24 HRS): SARS Coronavirus 2: NEGATIVE

## 2021-01-17 NOTE — H&P (Signed)
Robin Warren is an 70 y.o. female G58 with symptomatic uterine prolapse.  Patient has noticed pelvic pressure and low back pain which has worsened over the last few years; negative urine culture.  She also reports vaginal foreign body sensation.  No loss of bowel or bladder function.  Patient is not SA.  No prior surgical hx.  Small anteverted uterus on bimanual exam with no adnexal mass palpable.  Stage II apical prolapse.  Pertinent Gynecological History: Menses: post-menopausal Bleeding: n/a Contraception: post menopausal status DES exposure: unknown Blood transfusions:  40-50 years ago Sexually transmitted diseases: no past history Previous GYN Procedures:  none   Last mammogram: normal Date: 11/30/20 Last pap: normal Date: 08/30/17 OB History: G0   Menstrual History: Menarche age: n/a No LMP recorded. Patient is postmenopausal.    Past Medical History:  Diagnosis Date   Cataracts, both eyes 01/13/2021   Family history of adverse reaction to anesthesia    dad N/V   GERD (gastroesophageal reflux disease)    GI bleed    40 to 50 yrs ago per pt on 01-13-2021 1 unit blood given then   Laryngitis 01/10/2021   amoxicilin 875 bid x 10 days, pt to notify surgery if not better in 4 to 5 days   Motion sickness 01/13/2021   occ   TMJ (dislocation of temporomandibular joint) 01/13/2021   Wears glasses 01/13/2021    Past Surgical History:  Procedure Laterality Date   COLONOSCOPY WITH PROPOFOL N/A 08/12/2020   Procedure: COLONOSCOPY WITH PROPOFOL;  Surgeon: Lucilla Lame, MD;  Location: [facility] ENDOSCOPY;  Service: Endoscopy;  Laterality: N/A;   GANGLION CYST EXCISION Right    50 yrs ago per pt on 01-13-2021   NASOLACRIMAL DUCT PROBING W/ INSERTION OF STENT  07/25/2003    Family History  Problem Relation Age of Onset   Stroke Maternal Grandmother    Heart failure Maternal Grandfather    Parkinson's disease Paternal Grandmother    Heart attack Paternal Grandfather    Heart failure  Paternal Grandfather    Stroke Paternal Grandfather    Healthy Sister     Social History:  reports that she has never smoked. She has never used smokeless tobacco. She reports current alcohol use of about 1.0 standard drink of alcohol per week. She reports that she does not use drugs.  Allergies:  Allergies  Allergen Reactions   Aspirin Other (See Comments)    Stomach bleed    No medications prior to admission.    Review of Systems  Height 5\' 7"  (1.702 m), weight 59 kg. Physical Exam Constitutional:      Appearance: Normal appearance.  HENT:     Head: Normocephalic and atraumatic.     Nose: Nose normal.  Pulmonary:     Effort: Pulmonary effort is normal.  Abdominal:     General: Abdomen is flat.     Palpations: Abdomen is soft.  Genitourinary:    General: Normal vulva.  Musculoskeletal:        General: Normal range of motion.     Cervical back: Normal range of motion.  Skin:    General: Skin is warm and dry.  Neurological:     General: No focal deficit present.     Mental Status: She is alert and oriented to person, place, and time.  Psychiatric:        Mood and Affect: Mood normal.        Behavior: Behavior normal.    No results found for  this or any previous visit (from the past 24 hour(s)).  No results found.  Assessment/Plan: 70yo G0 with symptomatic uterine prolapse -Patient declines pelvic floor therapy and pessary -Plan LAVH, BSO-patient has been counseled re: risk of bleeding, infection, scarring, and damage to surrounding structures.  She is counseled that if unable to complete the procedure laparoscopically, will convert to abdominal procedure.  She is counseled re: recovery and steps of procedure.  All questions were answered and patient wishes to proceed.   Linda Hedges 01/17/2021, 8:21 AM

## 2021-01-19 NOTE — Anesthesia Preprocedure Evaluation (Addendum)
Anesthesia Evaluation  Patient identified by MRN, date of birth, ID band Patient awake    Reviewed: Allergy & Precautions, NPO status , Patient's Chart, lab work & pertinent test results  History of Anesthesia Complications Negative for: history of anesthetic complications  Airway Mallampati: II  TM Distance: >3 FB Neck ROM: Full    Dental no notable dental hx. (+) Dental Advisory Given   Pulmonary neg pulmonary ROS,    Pulmonary exam normal        Cardiovascular negative cardio ROS Normal cardiovascular exam     Neuro/Psych negative neurological ROS     GI/Hepatic Neg liver ROS, GERD  ,  Endo/Other  negative endocrine ROS  Renal/GU negative Renal ROS     Musculoskeletal negative musculoskeletal ROS (+)   Abdominal   Peds  Hematology negative hematology ROS (+)   Anesthesia Other Findings   Reproductive/Obstetrics                           Anesthesia Physical Anesthesia Plan  ASA: 2  Anesthesia Plan: General   Post-op Pain Management:    Induction: Intravenous  PONV Risk Score and Plan: 4 or greater and Ondansetron, Dexamethasone, Midazolam and Scopolamine patch - Pre-op  Airway Management Planned: LMA  Additional Equipment:   Intra-op Plan:   Post-operative Plan: Extubation in OR  Informed Consent: I have reviewed the patients History and Physical, chart, labs and discussed the procedure including the risks, benefits and alternatives for the proposed anesthesia with the patient or authorized representative who has indicated his/her understanding and acceptance.     Dental advisory given  Plan Discussed with: Anesthesiologist and CRNA  Anesthesia Plan Comments:        Anesthesia Quick Evaluation

## 2021-01-20 ENCOUNTER — Encounter (HOSPITAL_BASED_OUTPATIENT_CLINIC_OR_DEPARTMENT_OTHER): Payer: Self-pay | Admitting: Obstetrics & Gynecology

## 2021-01-20 ENCOUNTER — Observation Stay (HOSPITAL_BASED_OUTPATIENT_CLINIC_OR_DEPARTMENT_OTHER)
Admission: RE | Admit: 2021-01-20 | Discharge: 2021-01-21 | Disposition: A | Discharge status: Home / Self Care | Payer: Medicare HMO | Attending: Obstetrics & Gynecology | Admitting: Obstetrics & Gynecology

## 2021-01-20 ENCOUNTER — Observation Stay (HOSPITAL_BASED_OUTPATIENT_CLINIC_OR_DEPARTMENT_OTHER): Payer: Medicare HMO | Admitting: Anesthesiology

## 2021-01-20 ENCOUNTER — Encounter (HOSPITAL_BASED_OUTPATIENT_CLINIC_OR_DEPARTMENT_OTHER)
Admission: RE | Disposition: A | Discharge status: Home / Self Care | Payer: Self-pay | Source: Home / Self Care | Attending: Obstetrics & Gynecology

## 2021-01-20 DIAGNOSIS — N814 Uterovaginal prolapse, unspecified: Secondary | ICD-10-CM | POA: Diagnosis not present

## 2021-01-20 DIAGNOSIS — D259 Leiomyoma of uterus, unspecified: Secondary | ICD-10-CM | POA: Diagnosis not present

## 2021-01-20 DIAGNOSIS — Z90710 Acquired absence of both cervix and uterus: Secondary | ICD-10-CM

## 2021-01-20 DIAGNOSIS — D252 Subserosal leiomyoma of uterus: Secondary | ICD-10-CM | POA: Insufficient documentation

## 2021-01-20 DIAGNOSIS — D251 Intramural leiomyoma of uterus: Secondary | ICD-10-CM | POA: Diagnosis not present

## 2021-01-20 DIAGNOSIS — N83291 Other ovarian cyst, right side: Secondary | ICD-10-CM | POA: Diagnosis not present

## 2021-01-20 DIAGNOSIS — N841 Polyp of cervix uteri: Secondary | ICD-10-CM | POA: Insufficient documentation

## 2021-01-20 DIAGNOSIS — E559 Vitamin D deficiency, unspecified: Secondary | ICD-10-CM | POA: Diagnosis not present

## 2021-01-20 DIAGNOSIS — N840 Polyp of corpus uteri: Secondary | ICD-10-CM | POA: Diagnosis not present

## 2021-01-20 DIAGNOSIS — K219 Gastro-esophageal reflux disease without esophagitis: Secondary | ICD-10-CM | POA: Diagnosis not present

## 2021-01-20 DIAGNOSIS — N83201 Unspecified ovarian cyst, right side: Secondary | ICD-10-CM | POA: Diagnosis not present

## 2021-01-20 HISTORY — DX: Gastrointestinal hemorrhage, unspecified: K92.2

## 2021-01-20 HISTORY — PX: LAPAROSCOPIC VAGINAL HYSTERECTOMY WITH SALPINGO OOPHORECTOMY: SHX6681

## 2021-01-20 LAB — ABO/RH: ABO/RH(D): A POS

## 2021-01-20 LAB — TYPE AND SCREEN
ABO/RH(D): A POS
Antibody Screen: NEGATIVE

## 2021-01-20 SURGERY — HYSTERECTOMY, VAGINAL, LAPAROSCOPY-ASSISTED, WITH SALPINGO-OOPHORECTOMY
Anesthesia: General | Laterality: Bilateral

## 2021-01-20 MED ORDER — DOCUSATE SODIUM 100 MG PO CAPS
ORAL_CAPSULE | ORAL | Status: AC
  Filled 2021-01-20: qty 1

## 2021-01-20 MED ORDER — ACETAMINOPHEN 500 MG PO TABS
ORAL_TABLET | ORAL | Status: AC
  Filled 2021-01-20: qty 2

## 2021-01-20 MED ORDER — PHENYLEPHRINE 40 MCG/ML (10ML) SYRINGE FOR IV PUSH (FOR BLOOD PRESSURE SUPPORT)
PREFILLED_SYRINGE | INTRAVENOUS | Status: DC | PRN
  Administered 2021-01-20 (×2): 80 ug via INTRAVENOUS

## 2021-01-20 MED ORDER — POVIDONE-IODINE 10 % EX SWAB
2.0000 "application " | Freq: Once | CUTANEOUS | Status: AC
  Administered 2021-01-20: 2 via TOPICAL

## 2021-01-20 MED ORDER — AMISULPRIDE (ANTIEMETIC) 5 MG/2ML IV SOLN: 10.0000 mg | Freq: Once | INTRAVENOUS | Status: DC | PRN

## 2021-01-20 MED ORDER — HYDROMORPHONE HCL 1 MG/ML IJ SOLN
INTRAMUSCULAR | Status: AC
  Filled 2021-01-20: qty 1

## 2021-01-20 MED ORDER — ONDANSETRON HCL 4 MG/2ML IJ SOLN
INTRAMUSCULAR | Status: DC | PRN
  Administered 2021-01-20: 4 mg via INTRAVENOUS

## 2021-01-20 MED ORDER — CEFAZOLIN SODIUM-DEXTROSE 2-4 GM/100ML-% IV SOLN
2.0000 g | INTRAVENOUS | Status: AC
  Administered 2021-01-20: 2 g via INTRAVENOUS

## 2021-01-20 MED ORDER — ARTIFICIAL TEARS OPHTHALMIC OINT
TOPICAL_OINTMENT | OPHTHALMIC | Status: AC
  Filled 2021-01-20: qty 3.5

## 2021-01-20 MED ORDER — OXYCODONE-ACETAMINOPHEN 5-325 MG PO TABS
1.0000 | ORAL_TABLET | ORAL | Status: DC | PRN
  Administered 2021-01-20 – 2021-01-21 (×3): 1 via ORAL

## 2021-01-20 MED ORDER — DOCUSATE SODIUM 100 MG PO CAPS
100.0000 mg | ORAL_CAPSULE | Freq: Two times a day (BID) | ORAL | Status: DC
  Administered 2021-01-20 (×2): 100 mg via ORAL

## 2021-01-20 MED ORDER — MIDAZOLAM HCL 2 MG/2ML IJ SOLN
INTRAMUSCULAR | Status: AC
  Filled 2021-01-20: qty 2

## 2021-01-20 MED ORDER — BUPIVACAINE HCL (PF) 0.25 % IJ SOLN
INTRAMUSCULAR | Status: DC | PRN
  Administered 2021-01-20: 4 mL

## 2021-01-20 MED ORDER — LACTATED RINGERS IV SOLN: INTRAVENOUS | Status: DC

## 2021-01-20 MED ORDER — FENTANYL CITRATE (PF) 100 MCG/2ML IJ SOLN: 25.0000 ug | INTRAMUSCULAR | Status: DC | PRN

## 2021-01-20 MED ORDER — OXYCODONE-ACETAMINOPHEN 5-325 MG PO TABS
ORAL_TABLET | ORAL | Status: AC
  Filled 2021-01-20: qty 1

## 2021-01-20 MED ORDER — FENTANYL CITRATE (PF) 100 MCG/2ML IJ SOLN
INTRAMUSCULAR | Status: DC | PRN
  Administered 2021-01-20: 100 ug via INTRAVENOUS
  Administered 2021-01-20 (×3): 50 ug via INTRAVENOUS

## 2021-01-20 MED ORDER — AMOXICILLIN 875 MG PO TABS: 875.0000 mg | ORAL_TABLET | Freq: Two times a day (BID) | ORAL | Status: DC

## 2021-01-20 MED ORDER — ONDANSETRON HCL 4 MG/2ML IJ SOLN
INTRAMUSCULAR | Status: AC
  Filled 2021-01-20: qty 2

## 2021-01-20 MED ORDER — CEFAZOLIN SODIUM-DEXTROSE 2-4 GM/100ML-% IV SOLN
INTRAVENOUS | Status: AC
  Filled 2021-01-20: qty 100

## 2021-01-20 MED ORDER — 0.9 % SODIUM CHLORIDE (POUR BTL) OPTIME
TOPICAL | Status: DC | PRN
  Administered 2021-01-20: 500 mL

## 2021-01-20 MED ORDER — SIMETHICONE 80 MG PO CHEW: 80.0000 mg | CHEWABLE_TABLET | Freq: Four times a day (QID) | ORAL | Status: DC | PRN

## 2021-01-20 MED ORDER — PROMETHAZINE HCL 25 MG/ML IJ SOLN: 6.2500 mg | INTRAMUSCULAR | Status: DC | PRN

## 2021-01-20 MED ORDER — ACETAMINOPHEN 500 MG PO TABS
1000.0000 mg | ORAL_TABLET | Freq: Once | ORAL | Status: AC
  Administered 2021-01-20: 1000 mg via ORAL

## 2021-01-20 MED ORDER — ONDANSETRON HCL 4 MG/2ML IJ SOLN
4.0000 mg | Freq: Four times a day (QID) | INTRAMUSCULAR | Status: DC | PRN
  Administered 2021-01-20: 4 mg via INTRAVENOUS

## 2021-01-20 MED ORDER — HYDROMORPHONE HCL 1 MG/ML IJ SOLN
0.2000 mg | INTRAMUSCULAR | Status: DC | PRN
  Administered 2021-01-20: 0.5 mg via INTRAVENOUS

## 2021-01-20 MED ORDER — PROPOFOL 10 MG/ML IV BOLUS
INTRAVENOUS | Status: DC | PRN
  Administered 2021-01-20: 120 mg via INTRAVENOUS
  Administered 2021-01-20: 30 mg via INTRAVENOUS

## 2021-01-20 MED ORDER — FENTANYL CITRATE (PF) 250 MCG/5ML IJ SOLN
INTRAMUSCULAR | Status: AC
  Filled 2021-01-20: qty 5

## 2021-01-20 MED ORDER — SUGAMMADEX SODIUM 200 MG/2ML IV SOLN
INTRAVENOUS | Status: DC | PRN
  Administered 2021-01-20: 200 mg via INTRAVENOUS

## 2021-01-20 MED ORDER — PROPOFOL 10 MG/ML IV BOLUS
INTRAVENOUS | Status: AC
  Filled 2021-01-20: qty 20

## 2021-01-20 MED ORDER — ACETAMINOPHEN 325 MG PO TABS: 650.0000 mg | ORAL_TABLET | ORAL | Status: DC | PRN

## 2021-01-20 MED ORDER — MENTHOL 3 MG MT LOZG: 1.0000 | LOZENGE | OROMUCOSAL | Status: DC | PRN

## 2021-01-20 MED ORDER — LIDOCAINE 2% (20 MG/ML) 5 ML SYRINGE
INTRAMUSCULAR | Status: DC | PRN
  Administered 2021-01-20: 100 mg via INTRAVENOUS

## 2021-01-20 MED ORDER — ONDANSETRON HCL 4 MG PO TABS: 4.0000 mg | ORAL_TABLET | Freq: Four times a day (QID) | ORAL | Status: DC | PRN

## 2021-01-20 MED ORDER — LABETALOL HCL 5 MG/ML IV SOLN
INTRAVENOUS | Status: AC
  Filled 2021-01-20: qty 4

## 2021-01-20 MED ORDER — DEXAMETHASONE SODIUM PHOSPHATE 10 MG/ML IJ SOLN
INTRAMUSCULAR | Status: AC
  Filled 2021-01-20: qty 1

## 2021-01-20 MED ORDER — PHENYLEPHRINE 40 MCG/ML (10ML) SYRINGE FOR IV PUSH (FOR BLOOD PRESSURE SUPPORT)
PREFILLED_SYRINGE | INTRAVENOUS | Status: AC
  Filled 2021-01-20: qty 10

## 2021-01-20 MED ORDER — ROCURONIUM BROMIDE 10 MG/ML (PF) SYRINGE
PREFILLED_SYRINGE | INTRAVENOUS | Status: DC | PRN
  Administered 2021-01-20: 20 mg via INTRAVENOUS
  Administered 2021-01-20: 80 mg via INTRAVENOUS

## 2021-01-20 MED ORDER — ROCURONIUM BROMIDE 10 MG/ML (PF) SYRINGE
PREFILLED_SYRINGE | INTRAVENOUS | Status: AC
  Filled 2021-01-20: qty 10

## 2021-01-20 MED ORDER — MIDAZOLAM HCL 5 MG/5ML IJ SOLN
INTRAMUSCULAR | Status: DC | PRN
  Administered 2021-01-20: 1 mg via INTRAVENOUS

## 2021-01-20 MED ORDER — LIDOCAINE HCL (PF) 2 % IJ SOLN
INTRAMUSCULAR | Status: AC
  Filled 2021-01-20: qty 5

## 2021-01-20 MED ORDER — SODIUM CHLORIDE 0.9 % IV SOLN
6.2500 mg | Freq: Four times a day (QID) | INTRAVENOUS | Status: DC | PRN
  Administered 2021-01-20: 6.25 mg via INTRAVENOUS
  Filled 2021-01-20 (×4): qty 0.25

## 2021-01-20 MED ORDER — DEXAMETHASONE SODIUM PHOSPHATE 10 MG/ML IJ SOLN
INTRAMUSCULAR | Status: DC | PRN
  Administered 2021-01-20: 10 mg via INTRAVENOUS

## 2021-01-20 MED ORDER — LABETALOL HCL 5 MG/ML IV SOLN
INTRAVENOUS | Status: DC | PRN
  Administered 2021-01-20 (×2): 2.5 mg via INTRAVENOUS

## 2021-01-20 SURGICAL SUPPLY — 54 items
BLADE CLIPPER SENSICLIP SURGIC (BLADE) IMPLANT
CATH ROBINSON RED A/P 16FR (CATHETERS) ×2 IMPLANT
COVER BACK TABLE 60X90IN (DRAPES) ×2 IMPLANT
COVER LIGHT HANDLE  1/PK (MISCELLANEOUS) ×2
COVER LIGHT HANDLE 1/PK (MISCELLANEOUS) ×1 IMPLANT
COVER MAYO STAND STRL (DRAPES) ×4 IMPLANT
DECANTER SPIKE VIAL GLASS SM (MISCELLANEOUS) IMPLANT
DERMABOND ADVANCED (GAUZE/BANDAGES/DRESSINGS) ×1
DERMABOND ADVANCED .7 DNX12 (GAUZE/BANDAGES/DRESSINGS) ×1 IMPLANT
DRSG COVADERM PLUS 2X2 (GAUZE/BANDAGES/DRESSINGS) ×2 IMPLANT
DRSG OPSITE POSTOP 3X4 (GAUZE/BANDAGES/DRESSINGS) ×2 IMPLANT
DURAPREP 26ML APPLICATOR (WOUND CARE) ×2 IMPLANT
ELECT REM PT RETURN 9FT ADLT (ELECTROSURGICAL)
ELECTRODE REM PT RTRN 9FT ADLT (ELECTROSURGICAL) IMPLANT
GAUZE 4X4 16PLY ~~LOC~~+RFID DBL (SPONGE) ×2 IMPLANT
GLOVE SURG POLYISO LF SZ5.5 (GLOVE) ×4 IMPLANT
GLOVE SURG POLYISO LF SZ7 (GLOVE) ×10 IMPLANT
GLOVE SURG POLYISO LF SZ7.5 (GLOVE) ×4 IMPLANT
GLOVE SURG UNDER POLY LF SZ6 (GLOVE) ×4 IMPLANT
HOLDER FOLEY CATH W/STRAP (MISCELLANEOUS) ×2 IMPLANT
IV NS 1000ML (IV SOLUTION)
IV NS 1000ML BAXH (IV SOLUTION) IMPLANT
IV NS IRRIG 3000ML ARTHROMATIC (IV SOLUTION) IMPLANT
KIT TURNOVER CYSTO (KITS) ×2 IMPLANT
LIGASURE IMPACT 36 18CM CVD LR (INSTRUMENTS) ×2 IMPLANT
LIGASURE LAP L-HOOKWIRE 5 44CM (INSTRUMENTS) ×2 IMPLANT
MANIPULATOR UTERINE 4.5 ZUMI (MISCELLANEOUS) IMPLANT
NEEDLE INSUFFLATION 120MM (ENDOMECHANICALS) ×2 IMPLANT
NS IRRIG 500ML POUR BTL (IV SOLUTION) ×2 IMPLANT
PACK LAVH (CUSTOM PROCEDURE TRAY) ×2 IMPLANT
PACK ROBOTIC GOWN (GOWN DISPOSABLE) ×2 IMPLANT
PACK TRENDGUARD 450 HYBRID PRO (MISCELLANEOUS) IMPLANT
PAD OB MATERNITY 4.3X12.25 (PERSONAL CARE ITEMS) ×2 IMPLANT
PAD PREP 24X48 CUFFED NSTRL (MISCELLANEOUS) ×2 IMPLANT
SCISSORS LAP 5X35 DISP (ENDOMECHANICALS) IMPLANT
SET SUCTION IRRIG HYDROSURG (IRRIGATION / IRRIGATOR) IMPLANT
SET TUBE SMOKE EVAC HIGH FLOW (TUBING) ×2 IMPLANT
SUT MNCRL 0 MO-4 VIOLET 18 CR (SUTURE) ×2 IMPLANT
SUT MNCRL AB 3-0 PS2 18 (SUTURE) ×2 IMPLANT
SUT MON AB 2-0 CT1 36 (SUTURE) ×2 IMPLANT
SUT MONOCRYL 0 MO 4 18  CR/8 (SUTURE) ×4
SUT VIC AB 0 CT1 27 (SUTURE)
SUT VIC AB 0 CT1 27XCR 8 STRN (SUTURE) IMPLANT
SUT VICRYL 0 TIES 12 18 (SUTURE) ×2 IMPLANT
SUT VICRYL 0 UR6 27IN ABS (SUTURE) ×2 IMPLANT
SYR BULB IRRIG 60ML STRL (SYRINGE) IMPLANT
SYR CONTROL 10ML LL (SYRINGE) ×2 IMPLANT
TOWEL OR 17X26 10 PK STRL BLUE (TOWEL DISPOSABLE) ×2 IMPLANT
TRAY FOLEY W/BAG SLVR 14FR LF (SET/KITS/TRAYS/PACK) ×2 IMPLANT
TRENDGUARD 450 HYBRID PRO PACK (MISCELLANEOUS)
TROCAR BLADELESS OPT 5 100 (ENDOMECHANICALS) ×2 IMPLANT
TROCAR XCEL NON-BLD 11X100MML (ENDOMECHANICALS) ×2 IMPLANT
WARMER LAPAROSCOPE (MISCELLANEOUS) ×2 IMPLANT
WATER STERILE IRR 500ML POUR (IV SOLUTION) IMPLANT

## 2021-01-20 NOTE — Op Note (Signed)
PROCEDURE DATE: 01/20/2021 PREOPERATIVE DIAGNOSIS: Uterine prolapse  POSTOPERATIVE DIAGNOSIS: The same  PROCEDURE: Laparoscopic Assisted Vaginal Hysterectomy, Bilateral Salpingo-oophorectomy  SURGEON: Dr. Linda Hedges  ASSISTANT: Dr. Joycelyn Schmid INDICATIONS: 70 y.o. G0 with symptomatic uterine prolapse desiring definitive surgical management. Risks of surgery were discussed with the patient including but not limited to: bleeding which may require transfusion or reoperation; infection which may require antibiotics; injury to bowel, bladder, ureters or other surrounding organs; need for additional procedures including laparotomy; thromboembolic phenomenon, incisional problems and other postoperative/anesthesia complications. Written informed consent was obtained.  FINDINGS: Small uterus with fundal fibroid, normal adnexa bilaterally. No evidence of endometriosis. Normal upper abdomen.   ANESTHESIA: General  ESTIMATED BLOOD LOSS: 20 ml  SPECIMENS: Uterus, cervix, bilateral fallopian tubes and ovaries  COMPLICATIONS: None immediate  PROCEDURE IN DETAIL: The patient received intravenous antibiotics and had sequential compression devices applied to her lower extremities while in the preoperative area. She was then taken to the operating room where general anesthesia was administered and was found to be adequate. She was placed in the dorsal lithotomy position, and was prepped and draped in a sterile manner. An in and out catheterization was performed. A uterine manipulator was then advanced into the uterus . After an adequate timeout was performed, attention was then turned to the patient's abdomen where a 10-mm skin incision was made in the umbilical fold. The Veress needle was carefully introduced into the peritoneal cavity through the abdominal wall. Intraperitoneal placement was confirmed by drop in intraabdominal pressure with insufflation of carbon dioxide gas. Adequate pneumoperitoneum was obtained, and  the 73mm trocar and sleeve were then advanced without difficulty into the abdomen where intraabdominal placement was confirmed by the laparoscope. A survey of the patient's pelvis and abdomen revealed entirely normal anatomy. Suprapubic 42mm port was then placed under direct visualization. The pelvis was then carefully examined. On the right side, the fallopian tube was elevated for retraction.  The right IP was clamped, cauterized and cut using the Ligasure.  The round ligament was then clamped and transected with the Ligasure. The uteroovarian ligament was also clamped and transected. The leaves of the broad ligament were separated and serially transected.  These procedures were then repeated on the left side. The ureters were noted to be safely away from the area of dissection.  At this point, attention was turned to the vaginal portion of the case. A weighted speculum was placed posteriorly, a Deaver anteriorly, and the cervix grasped with a thyroid tenaculum. Once the anterior and posterior reflections were identified, the cervix was circumscribed using the Bovie knife. Next, using Mayos, the posterior cul-de-sac was entered. The LigaSure was then used to grasp the uterosacrals which were coapted and cut. Next, the bladder reflection was identified. Using Metzenbaums, it was entered and palpation and direct visualization confirmed proper location. Next, using the LigaSure, the uterine arteries were coapted and cut bilaterally. The pedicles were visualized after coaptation and were hemostatic. The same was performed sequentially cephalad until the uterus, cervix, and bilateral fallopian tubes and ovaries were removed. The pedicles were inspected and found to be hemostatic.  Next, the uterosacrals were tagged with monocryl bilaterally. The tagged uterosacrals were tied together in the midline.  The remaining cuff closure was performed using monocryl figure of eight stitches. The cuff was inspected and found to  be hemostatic. Foley catheter was placed and drained clear urine. Attention was returned to the abdomen were a second laparoscopic look was taken. All pedicles were hemostatic. Insufflation  was removed after all instruments were removed.  Infraumbilical fascia was closed with 0 vicryl figure of eight stitch.  Both skin incisions were closed with 4-0 monocryl subcuticular stitches and Dermabond. The patient tolerated the procedures well. All instruments, needles, and sponge counts were correct x 2. The patient was taken to the recovery room awake, extubated and in stable condition.

## 2021-01-20 NOTE — Anesthesia Postprocedure Evaluation (Signed)
Anesthesia Post Note  Patient: Robin Warren  Procedure(s) Performed: LAPAROSCOPIC ASSISTED VAGINAL HYSTERECTOMY WITH BILATERAL SALPINGO OOPHORECTOMY (Bilateral)     Patient location during evaluation: PACU Anesthesia Type: General Level of consciousness: sedated Pain management: pain level controlled Vital Signs Assessment: post-procedure vital signs reviewed and stable Respiratory status: spontaneous breathing and respiratory function stable Cardiovascular status: stable Postop Assessment: no apparent nausea or vomiting Anesthetic complications: no   No notable events documented.  Last Vitals:  Vitals:   01/20/21 1022 01/20/21 1045  BP: (!) 147/73 131/66  Pulse: 72 70  Resp: 16   Temp: 36.7 C 36.7 C  SpO2: 97% 98%    Last Pain:  Vitals:   01/20/21 1022  TempSrc:   PainSc: 0-No pain                 [name] DANIEL

## 2021-01-20 NOTE — Anesthesia Procedure Notes (Signed)
Procedure Name: Intubation Date/Time: 01/20/2021 7:35 AM Performed by: Rogers Blocker, CRNA Pre-anesthesia Checklist: Patient identified, Emergency Drugs available, Suction available and Patient being monitored Patient Re-evaluated:Patient Re-evaluated prior to induction Oxygen Delivery Method: Circle System Utilized Preoxygenation: Pre-oxygenation with 100% oxygen Induction Type: IV induction Ventilation: Mask ventilation without difficulty Laryngoscope Size: Mac and 3 Grade View: Grade I Tube type: Oral Number of attempts: 1 Airway Equipment and Method: Stylet Placement Confirmation: ETT inserted through vocal cords under direct vision, positive ETCO2 and breath sounds checked- equal and bilateral Secured at: 21 cm Tube secured with: Tape Dental Injury: Teeth and Oropharynx as per pre-operative assessment

## 2021-01-20 NOTE — Progress Notes (Signed)
Day of Surgery Procedure(s) (LRB): LAPAROSCOPIC ASSISTED VAGINAL HYSTERECTOMY WITH BILATERAL SALPINGO OOPHORECTOMY (Bilateral)  Subjective: Patient reports tolerating PO.  Patient had an episode of emesis after receiving narcotics and ambulating.  No nausea at this time.  Only discomfort is from catheter.  No CP/SOB.  Objective: I have reviewed patient's vital signs, intake and output, and medications.  General: alert, cooperative, and appears stated age GI: incision: clean, dry, and intact Extremities: extremities normal, atraumatic, no cyanosis or edema  Assessment: s/p Procedure(s): LAPAROSCOPIC ASSISTED VAGINAL HYSTERECTOMY WITH BILATERAL SALPINGO OOPHORECTOMY (Bilateral): stable  Plan: Advance diet Encourage ambulation AM labs pending D/C foley in AM Saline lock in AM Anticipate D/C home tomorrow am.   LOS: 0 days    Linda Hedges 01/20/2021, 6:21 PM

## 2021-01-20 NOTE — Progress Notes (Signed)
No change to H&P.  Robin Babington, DO 

## 2021-01-20 NOTE — Transfer of Care (Signed)
Immediate Anesthesia Transfer of Care Note  Patient: Robin Warren  Procedure(s) Performed: LAPAROSCOPIC ASSISTED VAGINAL HYSTERECTOMY WITH BILATERAL SALPINGO OOPHORECTOMY (Bilateral)  Patient Location: PACU  Anesthesia Type:General  Level of Consciousness: awake, alert , oriented and patient cooperative  Airway & Oxygen Therapy: Patient Spontanous Breathing and Patient connected to face mask oxygen  Post-op Assessment: Report given to RN and Post -op Vital signs reviewed and stable  Post vital signs: Reviewed and stable  Last Vitals:  Vitals Value Taken Time  BP 165/81 01/20/21 0928  Temp    Pulse 73 01/20/21 0931  Resp 14 01/20/21 0931  SpO2 95 % 01/20/21 0931  Vitals shown include unvalidated device data.  Last Pain:  Vitals:   01/20/21 0603  TempSrc: Oral  PainSc: 0-No pain      Patients Stated Pain Goal: 5 (29/51/88 4166)  Complications: No notable events documented.

## 2021-01-21 ENCOUNTER — Encounter (HOSPITAL_BASED_OUTPATIENT_CLINIC_OR_DEPARTMENT_OTHER): Payer: Self-pay | Admitting: Obstetrics & Gynecology

## 2021-01-21 DIAGNOSIS — D251 Intramural leiomyoma of uterus: Secondary | ICD-10-CM | POA: Diagnosis not present

## 2021-01-21 DIAGNOSIS — D252 Subserosal leiomyoma of uterus: Secondary | ICD-10-CM | POA: Diagnosis not present

## 2021-01-21 DIAGNOSIS — N814 Uterovaginal prolapse, unspecified: Secondary | ICD-10-CM | POA: Diagnosis not present

## 2021-01-21 DIAGNOSIS — N83291 Other ovarian cyst, right side: Secondary | ICD-10-CM | POA: Diagnosis not present

## 2021-01-21 DIAGNOSIS — N841 Polyp of cervix uteri: Secondary | ICD-10-CM | POA: Diagnosis not present

## 2021-01-21 LAB — CBC
HCT: 33.7 % — ABNORMAL LOW (ref 36.0–46.0)
Hemoglobin: 10.9 g/dL — ABNORMAL LOW (ref 12.0–15.0)
MCH: 31.1 pg (ref 26.0–34.0)
MCHC: 32.3 g/dL (ref 30.0–36.0)
MCV: 96.3 fL (ref 80.0–100.0)
Platelets: 201 10*3/uL (ref 150–400)
RBC: 3.5 MIL/uL — ABNORMAL LOW (ref 3.87–5.11)
RDW: 12.8 % (ref 11.5–15.5)
WBC: 12.7 10*3/uL — ABNORMAL HIGH (ref 4.0–10.5)
nRBC: 0 % (ref 0.0–0.2)

## 2021-01-21 LAB — SURGICAL PATHOLOGY

## 2021-01-21 LAB — COMPREHENSIVE METABOLIC PANEL
ALT: 18 U/L (ref 0–44)
AST: 19 U/L (ref 15–41)
Albumin: 3.3 g/dL — ABNORMAL LOW (ref 3.5–5.0)
Alkaline Phosphatase: 42 U/L (ref 38–126)
Anion gap: 8 (ref 5–15)
BUN: 12 mg/dL (ref 8–23)
CO2: 24 mmol/L (ref 22–32)
Calcium: 8.8 mg/dL — ABNORMAL LOW (ref 8.9–10.3)
Chloride: 106 mmol/L (ref 98–111)
Creatinine, Ser: 0.53 mg/dL (ref 0.44–1.00)
GFR, Estimated: >60 mL/min (ref >60)
Glucose, Bld: 140 mg/dL — ABNORMAL HIGH (ref 70–99)
Potassium: 3.8 mmol/L (ref 3.5–5.1)
Sodium: 138 mmol/L (ref 135–145)
Total Bilirubin: 0.6 mg/dL (ref 0.3–1.2)
Total Protein: 5.7 g/dL — ABNORMAL LOW (ref 6.5–8.1)

## 2021-01-21 MED ORDER — OXYCODONE-ACETAMINOPHEN 5-325 MG PO TABS: 1.0000 | ORAL_TABLET | ORAL | 0 refills | Status: AC | PRN

## 2021-01-21 MED ORDER — OXYCODONE-ACETAMINOPHEN 5-325 MG PO TABS
ORAL_TABLET | ORAL | Status: AC
  Filled 2021-01-21: qty 1

## 2021-01-21 NOTE — Discharge Summary (Signed)
Physician Discharge Summary  Patient ID: Robin Warren MRN: [id number] DOB/AGE: 1950/10/31 70 y.o.  Admit date: 01/20/2021 Discharge date: 01/21/2021  Admission Diagnoses: Uterine prolapse  Discharge Diagnoses:  Active Problems:   Uterine prolapse   S/P laparoscopic assisted vaginal hysterectomy (LAVH)   Discharged Condition: good  Hospital Course: Patient was admitted on 6/30 for anticipated LAVH, BSO which was performed without complication.  Patient progressed well postoperatively with exception of one episode of emesis believed to be related to medication/narcotics on empty stomach.  On HD#1, patient was voiding without difficulty, passing flatus, had adequate pain control with po medications and no more nausea or vomiting.  She was discharged home with planned follow up in the office in 2 weeks.   Consults: None  Significant Diagnostic Studies: none  Treatments: surgery: LAVH, BSO  Discharge Exam: Blood pressure 123/62, pulse 84, temperature 98.2 F (36.8 C), resp. rate 16, height 5\' 7"  (1.702 m), weight 59.6 kg, SpO2 96 %. General appearance: alert, cooperative, and appears stated age Extremities: extremities normal, atraumatic, no cyanosis or edema Abdomen: soft, ND.  Incisions c/d/I x 2  Disposition: Discharge disposition: 01-Home or Self Care       Discharge Instructions     Discharge patient   Complete by: As directed    Discharge disposition: 01-Home or Self Care   Discharge patient date: 01/21/2021      Allergies as of 01/21/2021       Reactions   Aspirin Other (See Comments)   Stomach bleed        Medication List     TAKE these medications    AIRBORNE PO Take by mouth as needed.   amoxicillin 875 MG tablet Commonly known as: AMOXIL Take 1 tablet (875 mg total) by mouth 2 (two) times daily. What changed: additional instructions   calcium carbonate 500 MG chewable tablet Commonly known as: TUMS - dosed in mg elemental calcium Chew 2 tablets  by mouth daily.   cimetidine 200 MG tablet Commonly known as: TAGAMET Take 200 mg by mouth as needed.   guaiFENesin-dextromethorphan 100-10 MG/5ML syrup Commonly known as: ROBITUSSIN DM Take 5 mLs by mouth every 4 (four) hours as needed for cough.   ibuprofen 100 MG tablet Commonly known as: ADVIL Take 200 mg by mouth every 6 (six) hours as needed for fever.   loratadine 10 MG tablet Commonly known as: CLARITIN Take 10 mg by mouth daily.   MULTIPLE VITAMIN PO Take 1 tablet by mouth daily.   oxyCODONE-acetaminophen 5-325 MG tablet Commonly known as: PERCOCET/ROXICET Take 1 tablet by mouth every 4 (four) hours as needed for moderate pain.   vitamin C 1000 MG tablet Take 1,000 mg by mouth daily.   VITAMIN D PO Take by mouth daily.         Signed: Linda Hedges 01/21/2021, 7:19 AM

## 2021-01-21 NOTE — Discharge Instructions (Signed)
Call MD for T>100.4, heavy vaginal bleeding, severe abdominal pain, intractable nausea and/or vomiting, or respiratory distress.  Call office to schedule postop visit in 2 weeks.  Pelvic rest x 6 weeks.  No driving while taking narcotics.

## 2021-02-03 DIAGNOSIS — N760 Acute vaginitis: Secondary | ICD-10-CM | POA: Diagnosis not present

## 2021-02-07 DIAGNOSIS — R3915 Urgency of urination: Secondary | ICD-10-CM | POA: Diagnosis not present

## 2021-05-12 DIAGNOSIS — J309 Allergic rhinitis, unspecified: Secondary | ICD-10-CM | POA: Diagnosis not present

## 2021-05-12 DIAGNOSIS — Z7185 Encounter for immunization safety counseling: Secondary | ICD-10-CM | POA: Diagnosis not present

## 2021-05-12 DIAGNOSIS — M8580 Other specified disorders of bone density and structure, unspecified site: Secondary | ICD-10-CM | POA: Diagnosis not present

## 2021-05-12 DIAGNOSIS — G4700 Insomnia, unspecified: Secondary | ICD-10-CM | POA: Diagnosis not present

## 2021-05-12 DIAGNOSIS — S0300XA Dislocation of jaw, unspecified side, initial encounter: Secondary | ICD-10-CM | POA: Diagnosis not present

## 2021-05-12 DIAGNOSIS — E559 Vitamin D deficiency, unspecified: Secondary | ICD-10-CM | POA: Diagnosis not present

## 2021-06-20 DIAGNOSIS — Z9889 Other specified postprocedural states: Secondary | ICD-10-CM | POA: Diagnosis not present

## 2021-06-20 DIAGNOSIS — H2513 Age-related nuclear cataract, bilateral: Secondary | ICD-10-CM | POA: Diagnosis not present

## 2021-06-20 DIAGNOSIS — H18452 Nodular corneal degeneration, left eye: Secondary | ICD-10-CM | POA: Diagnosis not present

## 2021-06-27 DIAGNOSIS — J069 Acute upper respiratory infection, unspecified: Secondary | ICD-10-CM | POA: Diagnosis not present

## 2021-06-27 DIAGNOSIS — U071 COVID-19: Secondary | ICD-10-CM | POA: Diagnosis not present

## 2021-07-26 DIAGNOSIS — K5900 Constipation, unspecified: Secondary | ICD-10-CM | POA: Diagnosis not present

## 2021-07-26 DIAGNOSIS — K602 Anal fissure, unspecified: Secondary | ICD-10-CM | POA: Diagnosis not present

## 2021-07-26 DIAGNOSIS — K648 Other hemorrhoids: Secondary | ICD-10-CM | POA: Diagnosis not present

## 2021-08-22 DIAGNOSIS — K602 Anal fissure, unspecified: Secondary | ICD-10-CM | POA: Diagnosis not present

## 2021-08-22 DIAGNOSIS — K5900 Constipation, unspecified: Secondary | ICD-10-CM | POA: Diagnosis not present

## 2021-08-22 DIAGNOSIS — K648 Other hemorrhoids: Secondary | ICD-10-CM | POA: Diagnosis not present

## 2021-09-02 DIAGNOSIS — Z23 Encounter for immunization: Secondary | ICD-10-CM | POA: Diagnosis not present

## 2021-09-06 ENCOUNTER — Other Ambulatory Visit: Payer: Self-pay | Admitting: Family Medicine

## 2021-09-06 DIAGNOSIS — M8580 Other specified disorders of bone density and structure, unspecified site: Secondary | ICD-10-CM

## 2021-10-12 DIAGNOSIS — Z0000 Encounter for general adult medical examination without abnormal findings: Secondary | ICD-10-CM | POA: Diagnosis not present

## 2021-10-17 DIAGNOSIS — K649 Unspecified hemorrhoids: Secondary | ICD-10-CM | POA: Diagnosis not present

## 2021-10-17 DIAGNOSIS — R32 Unspecified urinary incontinence: Secondary | ICD-10-CM | POA: Diagnosis not present

## 2021-10-17 DIAGNOSIS — N3000 Acute cystitis without hematuria: Secondary | ICD-10-CM | POA: Diagnosis not present

## 2021-10-17 DIAGNOSIS — Z0000 Encounter for general adult medical examination without abnormal findings: Secondary | ICD-10-CM | POA: Diagnosis not present

## 2021-10-17 DIAGNOSIS — K5900 Constipation, unspecified: Secondary | ICD-10-CM | POA: Diagnosis not present

## 2021-10-19 ENCOUNTER — Emergency Department: Payer: Medicare Other

## 2021-10-19 ENCOUNTER — Emergency Department
Admission: EM | Admit: 2021-10-19 | Discharge: 2021-10-19 | Disposition: A | Discharge status: Home / Self Care | Payer: Medicare Other | Attending: Emergency Medicine | Admitting: Emergency Medicine

## 2021-10-19 ENCOUNTER — Other Ambulatory Visit: Payer: Self-pay

## 2021-10-19 ENCOUNTER — Encounter: Payer: Self-pay | Admitting: Intensive Care

## 2021-10-19 DIAGNOSIS — R0789 Other chest pain: Secondary | ICD-10-CM | POA: Diagnosis not present

## 2021-10-19 DIAGNOSIS — R091 Pleurisy: Secondary | ICD-10-CM | POA: Diagnosis not present

## 2021-10-19 DIAGNOSIS — Z20822 Contact with and (suspected) exposure to covid-19: Secondary | ICD-10-CM | POA: Diagnosis not present

## 2021-10-19 DIAGNOSIS — R509 Fever, unspecified: Secondary | ICD-10-CM | POA: Diagnosis not present

## 2021-10-19 DIAGNOSIS — R079 Chest pain, unspecified: Secondary | ICD-10-CM | POA: Diagnosis not present

## 2021-10-19 DIAGNOSIS — R0602 Shortness of breath: Secondary | ICD-10-CM | POA: Diagnosis not present

## 2021-10-19 DIAGNOSIS — R000 Tachycardia, unspecified: Secondary | ICD-10-CM | POA: Insufficient documentation

## 2021-10-19 DIAGNOSIS — I10 Essential (primary) hypertension: Secondary | ICD-10-CM | POA: Diagnosis not present

## 2021-10-19 DIAGNOSIS — N390 Urinary tract infection, site not specified: Secondary | ICD-10-CM | POA: Diagnosis not present

## 2021-10-19 LAB — URINALYSIS, ROUTINE W REFLEX MICROSCOPIC
Bacteria, UA: NONE SEEN
Bilirubin Urine: NEGATIVE
Glucose, UA: NEGATIVE mg/dL
Ketones, ur: NEGATIVE mg/dL
Leukocytes,Ua: NEGATIVE
Nitrite: NEGATIVE
Protein, ur: NEGATIVE mg/dL
Specific Gravity, Urine: 1.029 (ref 1.005–1.030)
Squamous Epithelial / HPF: NONE SEEN (ref 0–5)
pH: 8 (ref 5.0–8.0)

## 2021-10-19 LAB — COMPREHENSIVE METABOLIC PANEL
ALT: 18 U/L (ref 0–44)
AST: 30 U/L (ref 15–41)
Albumin: 4.4 g/dL (ref 3.5–5.0)
Alkaline Phosphatase: 69 U/L (ref 38–126)
Anion gap: 9 (ref 5–15)
BUN: 12 mg/dL (ref 8–23)
CO2: 28 mmol/L (ref 22–32)
Calcium: 9.5 mg/dL (ref 8.9–10.3)
Chloride: 103 mmol/L (ref 98–111)
Creatinine, Ser: 0.76 mg/dL (ref 0.44–1.00)
GFR, Estimated: >60 mL/min (ref >60)
Glucose, Bld: 112 mg/dL — ABNORMAL HIGH (ref 70–99)
Potassium: 3.7 mmol/L (ref 3.5–5.1)
Sodium: 140 mmol/L (ref 135–145)
Total Bilirubin: 0.8 mg/dL (ref 0.3–1.2)
Total Protein: 7.7 g/dL (ref 6.5–8.1)

## 2021-10-19 LAB — CBC
HCT: 41.1 % (ref 36.0–46.0)
Hemoglobin: 13 g/dL (ref 12.0–15.0)
MCH: 30.4 pg (ref 26.0–34.0)
MCHC: 31.6 g/dL (ref 30.0–36.0)
MCV: 96.3 fL (ref 80.0–100.0)
Platelets: 211 10*3/uL (ref 150–400)
RBC: 4.27 MIL/uL (ref 3.87–5.11)
RDW: 12.5 % (ref 11.5–15.5)
WBC: 13 10*3/uL — ABNORMAL HIGH (ref 4.0–10.5)
nRBC: 0 % (ref 0.0–0.2)

## 2021-10-19 LAB — TROPONIN I (HIGH SENSITIVITY)
Troponin I (High Sensitivity): 4 ng/L (ref <18)
Troponin I (High Sensitivity): 5 ng/L (ref <18)

## 2021-10-19 LAB — LIPASE, BLOOD: Lipase: 31 U/L (ref 11–51)

## 2021-10-19 LAB — RESP PANEL BY RT-PCR (FLU A&B, COVID) ARPGX2
Influenza A by PCR: NEGATIVE
Influenza B by PCR: NEGATIVE
SARS Coronavirus 2 by RT PCR: NEGATIVE

## 2021-10-19 LAB — LACTIC ACID, PLASMA: Lactic Acid, Venous: 2.5 mmol/L (ref 0.5–1.9)

## 2021-10-19 MED ORDER — IOHEXOL 350 MG/ML SOLN
100.0000 mL | Freq: Once | INTRAVENOUS | Status: AC | PRN
  Administered 2021-10-19: 100 mL via INTRAVENOUS

## 2021-10-19 MED ORDER — ACETAMINOPHEN 500 MG PO TABS
1000.0000 mg | ORAL_TABLET | ORAL | Status: AC
  Administered 2021-10-19: 1000 mg via ORAL
  Filled 2021-10-19: qty 2

## 2021-10-19 MED ORDER — SODIUM CHLORIDE 0.9 % IV BOLUS
500.0000 mL | Freq: Once | INTRAVENOUS | Status: AC
  Administered 2021-10-19: 500 mL via INTRAVENOUS

## 2021-10-19 NOTE — Discharge Instructions (Addendum)
Please set up follow-up with your primary doctor for reevaluation within the next couple of days. ? ?Return to the ER right away if you have sudden or severe worsening of pain, difficulty breathing, feel weak, passout, or develop other new concerns. ?

## 2021-10-19 NOTE — ED Triage Notes (Addendum)
Patient arrives by EMS from home with central chest pain that radiates to back. Reports no history of heart issues. Recently put on antibiotic for UTI and given vaccination (hep). EMS administered 1 inch nitro to right chest and patient was given 4 baby aspirin by fire. ?

## 2021-10-19 NOTE — ED Provider Notes (Signed)
? ?[facility] ?Provider Note ? ? ? Event Date/Time  ? First MD Initiated Contact with Patient 10/19/21 [phone]   ?  (approximate) ? ? ?History  ? ?Chest Pain ? ? ?HPI ? ?Robin Warren is a 71 y.o. female who per office visit from 06/20/2021 which I reviewed has a history of corneal degeneration ? ?Patient reports that she is typically healthy takes no prescriptions except she developed some lower back pain about 2 days ago and saw her primary doctor in [place] and started taking an antibiotic for possible urinary tract infection.  She reports that she is taken 2 doses and the back pain seems to be better now, but she noticed this morning during the middle the night she was having chest pain in her upper chest worse when she takes of breath ? ?She has never had any history of heart issues or lung issues ? ?She has not noticed any fever or weakness no nausea vomiting no abdominal pain.  The lower back pain she had seems to have resolved now ? ?She also reports a history of contraindication to aspirin, it caused GI bleeding for her when she was in her 15s prefers not to take any aspirin products ?  ?Denies any sort of rash or itching.  No swelling. ? ?Physical Exam  ? ?Triage Vital Signs: ?ED Triage Vitals  ?Enc Vitals Group  ?   BP 10/19/21 0756 (!) 149/71  ?   Pulse Rate 10/19/21 0756 (!) 117  ?   Resp 10/19/21 0756 20  ?   Temp 10/19/21 0801 99.9 ?F (37.7 ?C)  ?   Temp Source 10/19/21 0756 Oral  ?   SpO2 10/19/21 0755 98 %  ?   Weight 10/19/21 0759 134 lb (60.8 kg)  ?   Height 10/19/21 0759 '5\' 7"'$  (1.702 m)  ?   Head Circumference --   ?   Peak Flow --   ?   Pain Score 10/19/21 0756 4  ?   Pain Loc --   ?   Pain Edu? --   ?   Excl. in [place]? --   ? ? ?Most recent vital signs: ?Vitals:  ? 10/19/21 0900 10/19/21 1030  ?BP: 134/75 125/70  ?Pulse: (!) 106 100  ?Resp: 19 18  ?Temp:    ?SpO2: 96% 97%  ? ? ? ?General: Awake, no distress.  Very pleasant.  Noted to be just slightly tachycardic sinus  tachycardia ?CV:  Good peripheral perfusion.  Mild tachycardia.  In no acute distress ?Resp:  Normal effort.  Clear lung sounds bilateral.  Complains of pleuritic upper chest pain when she takes a deep breath ?Abd:  No distention.  Soft nontender nondistended throughout ?Other:  No lower extremity swelling or edema.  No calf tenderness ?No rash. ? ?ED Results / Procedures / Treatments  ? ?Labs ?(all labs ordered are listed, but only abnormal results are displayed) ?Labs Reviewed  ?CBC - Abnormal; Notable for the following components:  ?    Result Value  ? WBC 13.0 (*)   ? All other components within normal limits  ?COMPREHENSIVE METABOLIC PANEL - Abnormal; Notable for the following components:  ? Glucose, Bld 112 (*)   ? All other components within normal limits  ?URINALYSIS, ROUTINE W REFLEX MICROSCOPIC - Abnormal; Notable for the following components:  ? Color, Urine YELLOW (*)   ? APPearance HAZY (*)   ? Hgb urine dipstick SMALL (*)   ? All other components within  normal limits  ?LACTIC ACID, PLASMA - Abnormal; Notable for the following components:  ? Lactic Acid, Venous 2.5 (*)   ? All other components within normal limits  ?RESP PANEL BY RT-PCR (FLU A&B, COVID) [id number]  ?URINE CULTURE  ?LIPASE, BLOOD  ?TROPONIN I (HIGH SENSITIVITY)  ?TROPONIN I (HIGH SENSITIVITY)  ? ? ? ?EKG ? ?Reviewed interpreted by me at 753 ?Heart rate 115 ?QRS 170 ?QTc 430 ?Sinus tachycardia, no evidence of acute ischemia, mild nonspecific T wave abnormality ? ? ? ? ?RADIOLOGY ?CT Angio Chest PE W and/or Wo Contrast ? ?Result Date: 10/19/2021 ?CLINICAL DATA:  71 year old female with chest pain, shortness of breath, low-grade fever, tachycardia, pain radiating to the back, sepsis. Recently started antibiotic for UTI. EXAM: CT ANGIOGRAPHY CHEST CT ABDOMEN AND PELVIS WITH CONTRAST TECHNIQUE: Multidetector CT imaging of the chest was performed using the standard protocol during bolus administration of intravenous contrast. Multiplanar CT image  reconstructions and MIPs were obtained to evaluate the vascular anatomy. Multidetector CT imaging of the abdomen and pelvis was performed using the standard protocol during bolus administration of intravenous contrast. RADIATION DOSE REDUCTION: This exam was performed according to the departmental dose-optimization program which includes automated exposure control, adjustment of the mA and/or kV according to patient size and/or use of iterative reconstruction technique. CONTRAST:  165m OMNIPAQUE IOHEXOL 350 MG/ML SOLN COMPARISON:  None. FINDINGS: CTA CHEST FINDINGS Cardiovascular: Excellent contrast bolus timing in the pulmonary arterial tree. No focal filling defect identified in the pulmonary arteries to suggest acute pulmonary embolism. No cardiomegaly. No calcified coronary artery atherosclerosis is evident. Mildly tortuous thoracic aorta with minor aortic atherosclerosis. No pericardial effusion. Mediastinum/Nodes: Negative. No mediastinal mass or lymphadenopathy. Lungs/Pleura: Major airways are patent. Symmetric dependent atelectasis is mild. No pleural effusion or other acute pulmonary process. Musculoskeletal: Dextroconvex thoracic scoliosis. No acute osseous abnormality identified. Review of the MIP images confirms the above findings. CT ABDOMEN and PELVIS FINDINGS Hepatobiliary: Occasional tiny but circumscribed low-density areas in the liver are probably benign cysts (such as series 4, image 26, no follow-up recommended). Otherwise negative liver and gallbladder. Pancreas: Negative. Spleen: Negative. Adrenals/Urinary Tract: Normal adrenal glands. Renal enhancement and contrast excretion is symmetric and within normal limits. No nephrolithiasis or pararenal inflammation. Occasional small benign renal cysts (right lower pole series 4, image 51, no follow-up recommended). No hydroureter. Unremarkable bladder. Pelvic phleboliths but no urinary calculus identified. Stomach/Bowel: Gas in the rectum. Redundant  but decompressed sigmoid colon in the pelvis. Proximal sigmoid and descending diverticulosis. Diverticulosis at the splenic flexure. No active inflammation identified in those segments. Decompressed distal transverse colon. Right colon and hepatic flexure with mild gas and retained stool. Cecum is on a lax mesentery with normal appendix tracking to the right lower quadrant on coronal image 35. Negative terminal ileum. No dilated small bowel. Largely decompressed stomach and duodenum. No free air, free fluid. Vascular/Lymphatic: Calcified aortic atherosclerosis. Normal caliber abdominal aorta. Major arterial structures are patent. Portal venous system is patent. No lymphadenopathy identified. Reproductive: Surgically absent uterus. Diminutive or absent ovaries. Other: No pelvic free fluid.  Occasional pelvic phleboliths. Musculoskeletal: Disc, endplate, and facet degeneration at the lumbosacral junction. No acute osseous abnormality identified. Review of the MIP images confirms the above findings. IMPRESSION: 1. No acute pulmonary embolism. 2. No acute or inflammatory process identified in the chest, abdomen, or pelvis. Mild pulmonary atelectasis. Colonic diverticulosis without active inflammation. Aortic Atherosclerosis (ICD10-I70.0). Electronically Signed   By: HGenevie AnnM.D.   On: 10/19/2021 09:48  ? ?  CT ABDOMEN PELVIS W CONTRAST ? ?Result Date: 10/19/2021 ?CLINICAL DATA:  71 year old female with chest pain, shortness of breath, low-grade fever, tachycardia, pain radiating to the back, sepsis. Recently started antibiotic for UTI. EXAM: CT ANGIOGRAPHY CHEST CT ABDOMEN AND PELVIS WITH CONTRAST TECHNIQUE: Multidetector CT imaging of the chest was performed using the standard protocol during bolus administration of intravenous contrast. Multiplanar CT image reconstructions and MIPs were obtained to evaluate the vascular anatomy. Multidetector CT imaging of the abdomen and pelvis was performed using the standard protocol  during bolus administration of intravenous contrast. RADIATION DOSE REDUCTION: This exam was performed according to the departmental dose-optimization program which includes automated exposure control, adjus

## 2021-10-20 LAB — URINE CULTURE: Culture: NO GROWTH

## 2021-10-26 DIAGNOSIS — I700 Atherosclerosis of aorta: Secondary | ICD-10-CM | POA: Diagnosis not present

## 2021-10-26 DIAGNOSIS — R030 Elevated blood-pressure reading, without diagnosis of hypertension: Secondary | ICD-10-CM | POA: Diagnosis not present

## 2021-10-26 DIAGNOSIS — Z8709 Personal history of other diseases of the respiratory system: Secondary | ICD-10-CM | POA: Diagnosis not present

## 2021-12-28 DIAGNOSIS — Z1231 Encounter for screening mammogram for malignant neoplasm of breast: Secondary | ICD-10-CM | POA: Diagnosis not present

## 2021-12-28 DIAGNOSIS — Z01419 Encounter for gynecological examination (general) (routine) without abnormal findings: Secondary | ICD-10-CM | POA: Diagnosis not present

## 2021-12-28 DIAGNOSIS — Z6820 Body mass index (BMI) 20.0-20.9, adult: Secondary | ICD-10-CM | POA: Diagnosis not present

## 2022-02-09 ENCOUNTER — Other Ambulatory Visit: Payer: Medicare HMO

## 2022-03-29 DIAGNOSIS — H18452 Nodular corneal degeneration, left eye: Secondary | ICD-10-CM | POA: Diagnosis not present

## 2022-03-29 DIAGNOSIS — H2513 Age-related nuclear cataract, bilateral: Secondary | ICD-10-CM | POA: Diagnosis not present

## 2022-03-29 DIAGNOSIS — Z9889 Other specified postprocedural states: Secondary | ICD-10-CM | POA: Diagnosis not present

## 2022-05-01 DIAGNOSIS — H18453 Nodular corneal degeneration, bilateral: Secondary | ICD-10-CM | POA: Diagnosis not present

## 2022-05-01 DIAGNOSIS — Z9889 Other specified postprocedural states: Secondary | ICD-10-CM | POA: Diagnosis not present

## 2022-05-01 DIAGNOSIS — H2513 Age-related nuclear cataract, bilateral: Secondary | ICD-10-CM | POA: Diagnosis not present

## 2022-05-09 DIAGNOSIS — Z9889 Other specified postprocedural states: Secondary | ICD-10-CM | POA: Diagnosis not present

## 2022-05-09 DIAGNOSIS — Z48810 Encounter for surgical aftercare following surgery on the sense organs: Secondary | ICD-10-CM | POA: Diagnosis not present

## 2022-05-09 DIAGNOSIS — H2513 Age-related nuclear cataract, bilateral: Secondary | ICD-10-CM | POA: Diagnosis not present

## 2022-06-07 DIAGNOSIS — H6590 Unspecified nonsuppurative otitis media, unspecified ear: Secondary | ICD-10-CM | POA: Diagnosis not present

## 2022-07-26 DIAGNOSIS — H524 Presbyopia: Secondary | ICD-10-CM | POA: Diagnosis not present

## 2022-09-06 DIAGNOSIS — K080 Exfoliation of teeth due to systemic causes: Secondary | ICD-10-CM | POA: Diagnosis not present

## 2022-10-20 DIAGNOSIS — Z0000 Encounter for general adult medical examination without abnormal findings: Secondary | ICD-10-CM | POA: Diagnosis not present

## 2022-10-20 DIAGNOSIS — Z114 Encounter for screening for human immunodeficiency virus [HIV]: Secondary | ICD-10-CM | POA: Diagnosis not present

## 2022-10-20 DIAGNOSIS — Z13220 Encounter for screening for lipoid disorders: Secondary | ICD-10-CM | POA: Diagnosis not present

## 2022-10-23 DIAGNOSIS — Z23 Encounter for immunization: Secondary | ICD-10-CM | POA: Diagnosis not present

## 2022-10-23 DIAGNOSIS — Z0000 Encounter for general adult medical examination without abnormal findings: Secondary | ICD-10-CM | POA: Diagnosis not present

## 2022-10-23 DIAGNOSIS — H9319 Tinnitus, unspecified ear: Secondary | ICD-10-CM | POA: Diagnosis not present

## 2022-12-11 ENCOUNTER — Ambulatory Visit: Payer: Medicare Other | Attending: Audiologist | Admitting: Audiologist

## 2022-12-11 DIAGNOSIS — R94120 Abnormal auditory function study: Secondary | ICD-10-CM | POA: Insufficient documentation

## 2022-12-11 DIAGNOSIS — H903 Sensorineural hearing loss, bilateral: Secondary | ICD-10-CM | POA: Insufficient documentation

## 2022-12-11 NOTE — Procedures (Signed)
  [facility] [address] [place], [place]  [address] [phone]  AUDIOLOGICAL  EVALUATION  NAME: Robin Warren     DOB:   1950-11-28      MRN: [id number]                                                                                     DATE: 12/11/2022     REFERENT: Lewis Moccasin, MD STATUS: Outpatient DIAGNOSIS: Left Sensorineural Hearing Loss, Right Mixed Hearing Loss    History: Robin Warren was seen for an audiological evaluation.  Robin Warren is receiving a hearing evaluation due to concerns for hearing loss after she referred a screening with her doctor. Robin Warren reports no difficulty hearing in everyday situations, but reports she did not hear the tones during the hearing screen. Robin Warren reported a popping sensation and congestion in her right ear that began this morning. Tinnitus reported for both ears, but is a more noticeable humming sound in the right ear. Robin Warren has no history of noise exposure. No other relevant case history reported.   Evaluation:  Otoscopy showed a clear view of the tympanic membranes, bilaterally Tympanometry results were consistent with negative pressure in the right ear and normal middle ear function in the left.  Audiometric testing was completed using conventional audiometry with inserts transducer. Speech Recognition Thresholds were 40 dB in the right ear and 40 dB in the left ear. Word Recognition was performed 40dB SL for the right and left ears respectively, scored 96% in the right ear and 100% in the left ear. Pure tone thresholds show mild mixed hearing loss in the right ear and mild sensorineural hearing loss in the left ear.   Results:  The test results were reviewed with Robin Warren. She has a slight conductive component to her hearing loss in the right. Left ear hearing loss is sensorineural. Recommend she be tested again once congestion clears in right ear. She is not a hearing aid candidate at this time. Robin Warren agreed to  this plan of care.   Recommendations: Recommend assessment again in six months to see if conductive component resolves once not having allergy symptoms.  Recommend following up with Lewis Moccasin, MD for help with allergies and congestion due to abnormal middle ear function in right ear.    42 minutes spent testing and counseling on results.   Robin Warren  Audiologist, Au.D., CCC-A 12/11/2022  11:42 AM  Cc: Lewis Moccasin, MD

## 2023-01-01 DIAGNOSIS — Z1272 Encounter for screening for malignant neoplasm of vagina: Secondary | ICD-10-CM | POA: Diagnosis not present

## 2023-01-01 DIAGNOSIS — Z01419 Encounter for gynecological examination (general) (routine) without abnormal findings: Secondary | ICD-10-CM | POA: Diagnosis not present

## 2023-01-01 DIAGNOSIS — Z1231 Encounter for screening mammogram for malignant neoplasm of breast: Secondary | ICD-10-CM | POA: Diagnosis not present

## 2023-01-01 DIAGNOSIS — M8588 Other specified disorders of bone density and structure, other site: Secondary | ICD-10-CM | POA: Diagnosis not present

## 2023-01-01 DIAGNOSIS — Z6820 Body mass index (BMI) 20.0-20.9, adult: Secondary | ICD-10-CM | POA: Diagnosis not present

## 2023-01-01 DIAGNOSIS — Z1151 Encounter for screening for human papillomavirus (HPV): Secondary | ICD-10-CM | POA: Diagnosis not present

## 2023-01-23 DIAGNOSIS — K641 Second degree hemorrhoids: Secondary | ICD-10-CM | POA: Diagnosis not present

## 2023-02-06 DIAGNOSIS — Z9889 Other specified postprocedural states: Secondary | ICD-10-CM | POA: Diagnosis not present

## 2023-02-06 DIAGNOSIS — H2513 Age-related nuclear cataract, bilateral: Secondary | ICD-10-CM | POA: Diagnosis not present

## 2023-03-22 DIAGNOSIS — K080 Exfoliation of teeth due to systemic causes: Secondary | ICD-10-CM | POA: Diagnosis not present

## 2023-07-06 IMAGING — CT CT ANGIO CHEST
2 of 6 series · 17 of 46 positions shown · IV contrast (APPLIED)
Comparison: None.

CLINICAL DATA: 70-year-old female with chest pain, shortness of
breath, low-grade fever, tachycardia, pain radiating to the back,
sepsis. Recently started antibiotic for UTI.



[Series 3: cor · coronal · 0.62mm/px · 3 of 122 slices shown]
[im 31/122  soft-tissue]
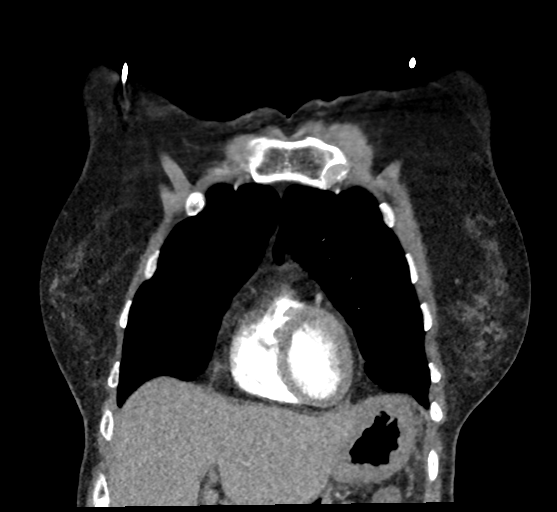
[im 61/122  soft-tissue]
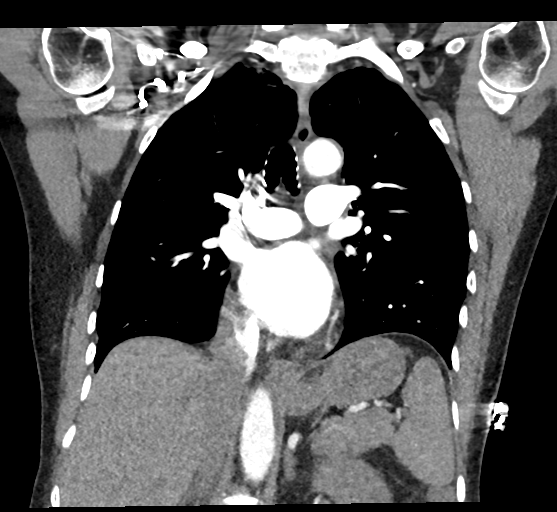
[im 91/122  soft-tissue]
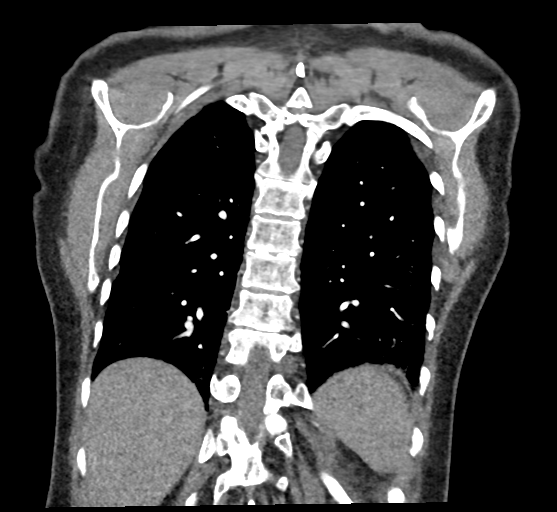

[Series 5: thins · axial · 0.65mm/px · z∈[+1336,+1606]mm · 14 of 423 slices shown]
[im 19/423  lung]
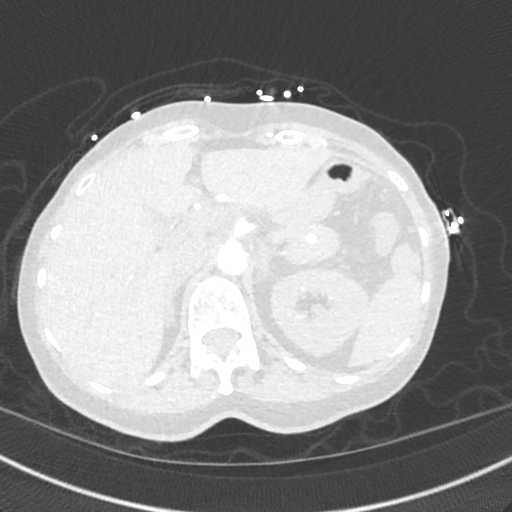
[im 56/423  soft-tissue]
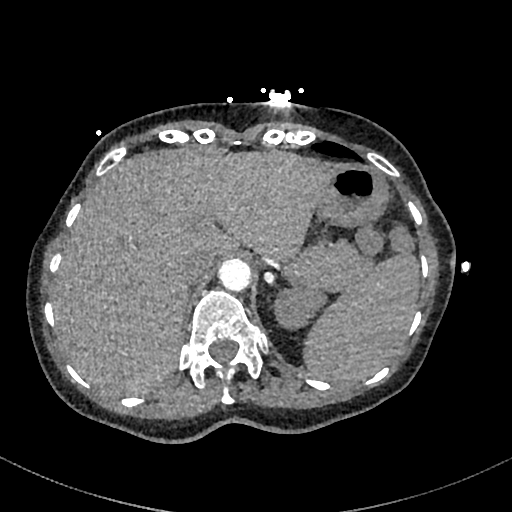
[im 74/423  lung]
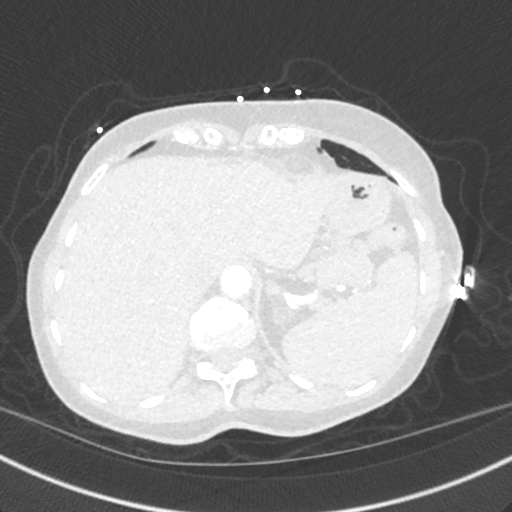
[im 111/423  soft-tissue]
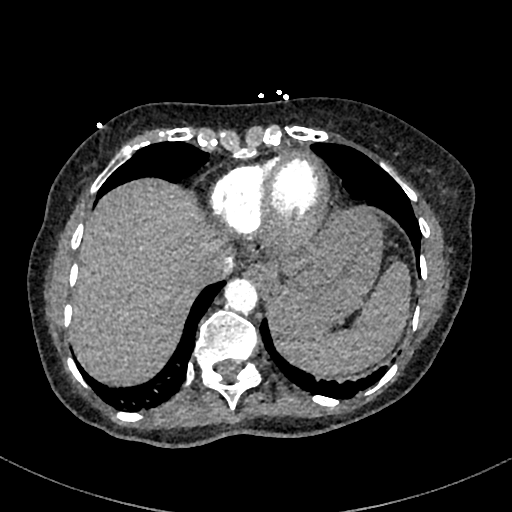
[im 147/423  lung]
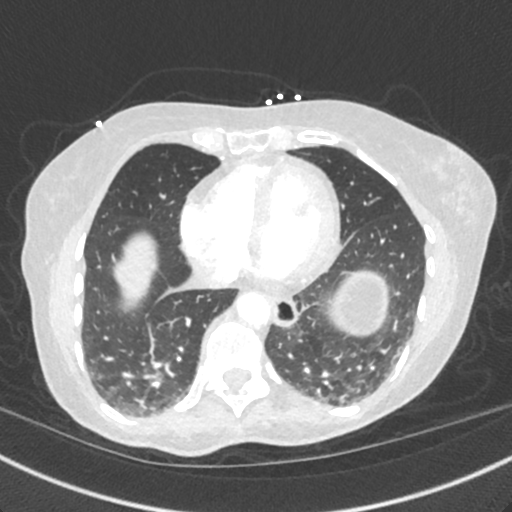
[im 166/423  soft-tissue]
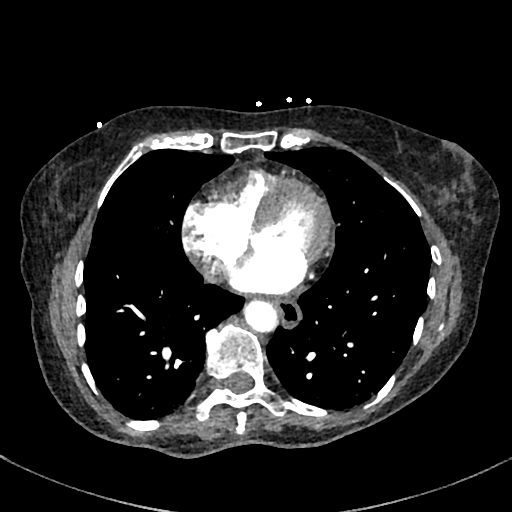
[im 202/423  lung]
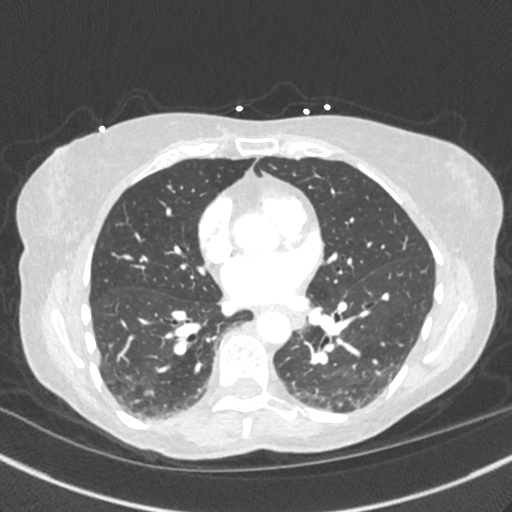
[im 221/423  soft-tissue]
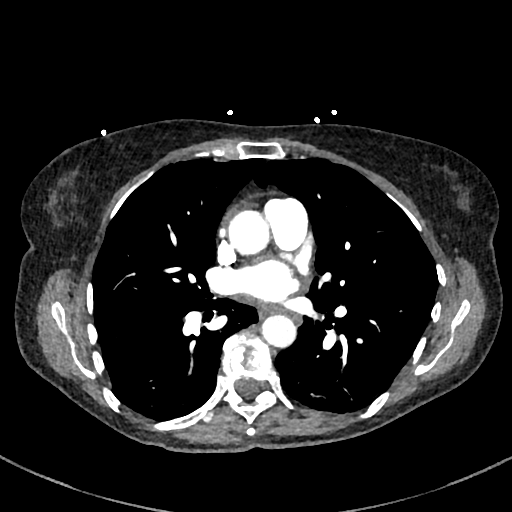
[im 257/423  lung]
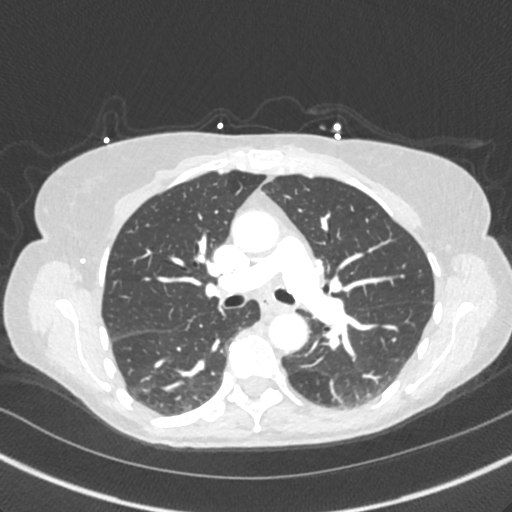
[im 276/423  soft-tissue]
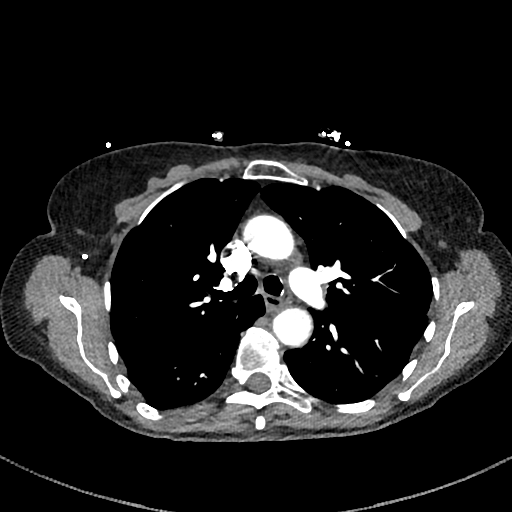
[im 312/423  lung]
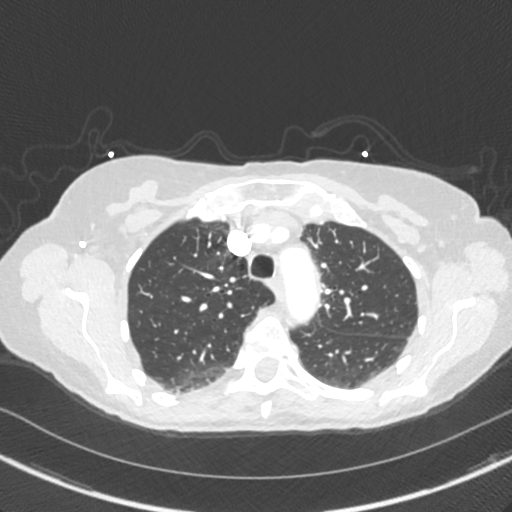
[im 349/423  soft-tissue]
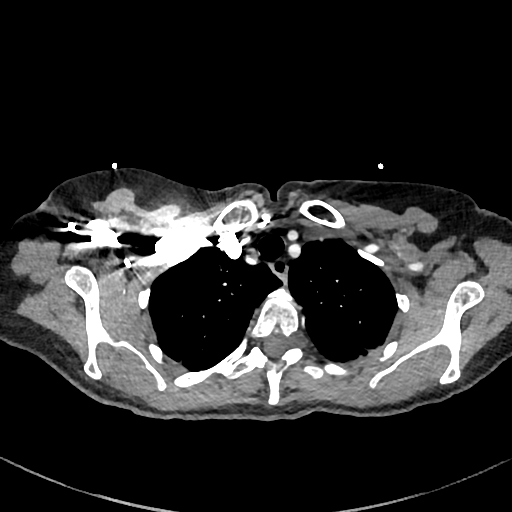
[im 367/423  lung]
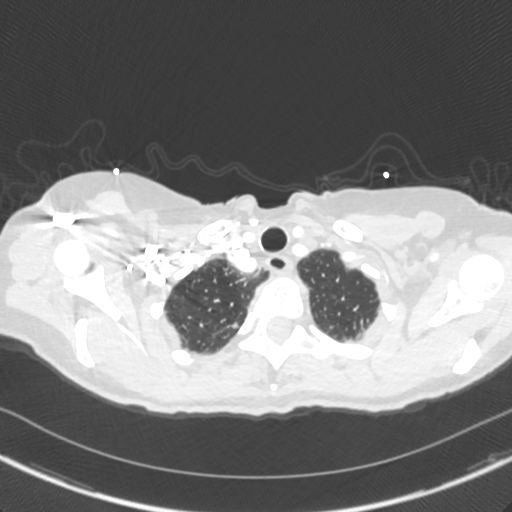
[im 404/423  soft-tissue]
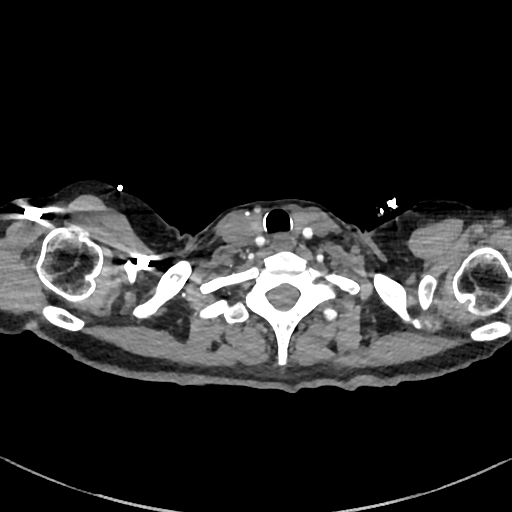

[17 of 46 positions shown; findings below may reference images not displayed]

RADIATION DOSE REDUCTION: This exam was performed according to the
departmental dose-optimization program which includes automated
exposure control, adjustment of the mA and/or kV according to
patient size and/or use of iterative reconstruction technique.

CONTRAST:  100mL OMNIPAQUE IOHEXOL 350 MG/ML SOLN
FINDINGS: CTA CHEST FINDINGS

Cardiovascular: Excellent contrast bolus timing in the pulmonary
arterial tree.

No focal filling defect identified in the pulmonary arteries to
suggest acute pulmonary embolism.

No cardiomegaly. No calcified coronary artery atherosclerosis is
evident. Mildly tortuous thoracic aorta with minor aortic
atherosclerosis. No pericardial effusion.

Mediastinum/Nodes: Negative. No mediastinal mass or lymphadenopathy.

Lungs/Pleura: Major airways are patent. Symmetric dependent
atelectasis is mild. No pleural effusion or other acute pulmonary
process.

Musculoskeletal: Dextroconvex thoracic scoliosis. No acute osseous
abnormality identified.

Review of the MIP images confirms the above findings.

CT ABDOMEN and PELVIS FINDINGS

Hepatobiliary: Occasional tiny but circumscribed low-density areas
in the liver are probably benign cysts (such as series 4, image 26,
no follow-up recommended). Otherwise negative liver and gallbladder.

Pancreas: Negative.

Spleen: Negative.

Adrenals/Urinary Tract: Normal adrenal glands. Renal enhancement and
contrast excretion is symmetric and within normal limits. No
nephrolithiasis or pararenal inflammation. Occasional small benign
renal cysts (right lower pole series 4, image 51, no follow-up
recommended). No hydroureter. Unremarkable bladder. Pelvic
phleboliths but no urinary calculus identified.

Stomach/Bowel: Gas in the rectum. Redundant but decompressed sigmoid
colon in the pelvis. Proximal sigmoid and descending diverticulosis.
Diverticulosis at the splenic flexure. No active inflammation
identified in those segments. Decompressed distal transverse colon.
Right colon and hepatic flexure with mild gas and retained stool.
Cecum is on a lax mesentery with normal appendix tracking to the
right lower quadrant on coronal image 35. Negative terminal ileum.
No dilated small bowel. Largely decompressed stomach and duodenum.
No free air, free fluid.

Vascular/Lymphatic: Calcified aortic atherosclerosis. Normal caliber
abdominal aorta. Major arterial structures are patent. Portal venous
system is patent. No lymphadenopathy identified.

Reproductive: Surgically absent uterus. Diminutive or absent
ovaries.

Other: No pelvic free fluid.  Occasional pelvic phleboliths.

Musculoskeletal: Disc, endplate, and facet degeneration at the
lumbosacral junction. No acute osseous abnormality identified.

Review of the MIP images confirms the above findings.
IMPRESSION: 1. No acute pulmonary embolism.

2. No acute or inflammatory process identified in the chest,
abdomen, or pelvis.
Mild pulmonary atelectasis.
Colonic diverticulosis without active inflammation.
Aortic Atherosclerosis ([id number]).

## 2024-07-01 ENCOUNTER — Other Ambulatory Visit: Payer: Self-pay | Admitting: Obstetrics & Gynecology

## 2024-07-01 DIAGNOSIS — R928 Other abnormal and inconclusive findings on diagnostic imaging of breast: Secondary | ICD-10-CM

## 2024-07-05 ENCOUNTER — Inpatient Hospital Stay
Admission: RE | Admit: 2024-07-05 | Discharge: 2024-07-05 | Attending: Obstetrics & Gynecology | Admitting: Obstetrics & Gynecology

## 2024-07-05 ENCOUNTER — Other Ambulatory Visit

## 2024-07-05 DIAGNOSIS — R928 Other abnormal and inconclusive findings on diagnostic imaging of breast: Secondary | ICD-10-CM

## 2024-07-11 ENCOUNTER — Other Ambulatory Visit

## 2024-07-11 ENCOUNTER — Encounter
